# Patient Record
Sex: Female | Born: 1955 | Race: White | Hispanic: No | Marital: Married | Smoking: Never smoker
Health system: Southern US, Community
[De-identification: ages and names within clinical notes are randomized; demographics above are authoritative.]

## PROBLEM LIST (undated history)

## (undated) DIAGNOSIS — I493 Ventricular premature depolarization: Secondary | ICD-10-CM

## (undated) DIAGNOSIS — K635 Polyp of colon: Secondary | ICD-10-CM

## (undated) DIAGNOSIS — K219 Gastro-esophageal reflux disease without esophagitis: Secondary | ICD-10-CM

## (undated) DIAGNOSIS — I10 Essential (primary) hypertension: Secondary | ICD-10-CM

## (undated) DIAGNOSIS — K5792 Diverticulitis of intestine, part unspecified, without perforation or abscess without bleeding: Secondary | ICD-10-CM

## (undated) DIAGNOSIS — R079 Chest pain, unspecified: Secondary | ICD-10-CM

## (undated) DIAGNOSIS — T7840XA Allergy, unspecified, initial encounter: Secondary | ICD-10-CM

## (undated) DIAGNOSIS — J45909 Unspecified asthma, uncomplicated: Secondary | ICD-10-CM

## (undated) DIAGNOSIS — E039 Hypothyroidism, unspecified: Secondary | ICD-10-CM

## (undated) DIAGNOSIS — J309 Allergic rhinitis, unspecified: Secondary | ICD-10-CM

## (undated) DIAGNOSIS — H1010 Acute atopic conjunctivitis, unspecified eye: Secondary | ICD-10-CM

## (undated) HISTORY — DX: Hypothyroidism, unspecified: E03.9

## (undated) HISTORY — DX: Unspecified asthma, uncomplicated: J45.909

## (undated) HISTORY — PX: FINGER SURGERY: SHX640

## (undated) HISTORY — DX: Polyp of colon: K63.5

## (undated) HISTORY — PX: OTHER SURGICAL HISTORY: SHX169

## (undated) HISTORY — PX: CYST REMOVAL TRUNK: SHX6283

## (undated) HISTORY — DX: Allergic rhinitis, unspecified: J30.9

## (undated) HISTORY — DX: Ventricular premature depolarization: I49.3

## (undated) HISTORY — DX: Diverticulitis of intestine, part unspecified, without perforation or abscess without bleeding: K57.92

## (undated) HISTORY — PX: CHOLECYSTECTOMY: SHX55

## (undated) HISTORY — DX: Essential (primary) hypertension: I10

## (undated) HISTORY — DX: Acute atopic conjunctivitis, unspecified eye: H10.10

## (undated) HISTORY — DX: Gastro-esophageal reflux disease without esophagitis: K21.9

## (undated) HISTORY — DX: Chest pain, unspecified: R07.9

## (undated) HISTORY — DX: Allergy, unspecified, initial encounter: T78.40XA

## (undated) HISTORY — PX: OVARIAN CYST REMOVAL: SHX89

## (undated) HISTORY — PX: UMBILICAL HERNIA REPAIR: SUR1181

## (undated) HISTORY — PX: HERNIA REPAIR: SHX51

## (undated) HISTORY — PX: ABDOMINAL HYSTERECTOMY: SUR658

---

## 1998-07-12 ENCOUNTER — Other Ambulatory Visit: Admission: RE | Admit: 1998-07-12 | Discharge: 1998-07-12 | Payer: Self-pay | Admitting: *Deleted

## 1999-09-21 ENCOUNTER — Other Ambulatory Visit: Admission: RE | Admit: 1999-09-21 | Discharge: 1999-09-21 | Payer: Self-pay | Admitting: *Deleted

## 2000-09-24 ENCOUNTER — Other Ambulatory Visit: Admission: RE | Admit: 2000-09-24 | Discharge: 2000-09-24 | Payer: Self-pay | Admitting: *Deleted

## 2002-01-23 ENCOUNTER — Other Ambulatory Visit: Admission: RE | Admit: 2002-01-23 | Discharge: 2002-01-23 | Payer: Self-pay | Admitting: *Deleted

## 2003-02-16 ENCOUNTER — Other Ambulatory Visit: Admission: RE | Admit: 2003-02-16 | Discharge: 2003-02-16 | Payer: Self-pay | Admitting: *Deleted

## 2003-03-19 ENCOUNTER — Ambulatory Visit (HOSPITAL_BASED_OUTPATIENT_CLINIC_OR_DEPARTMENT_OTHER): Admission: RE | Admit: 2003-03-19 | Discharge: 2003-03-19 | Payer: Self-pay | Admitting: Orthopedic Surgery

## 2004-03-08 ENCOUNTER — Other Ambulatory Visit: Admission: RE | Admit: 2004-03-08 | Discharge: 2004-03-08 | Payer: Self-pay | Admitting: *Deleted

## 2004-05-04 ENCOUNTER — Other Ambulatory Visit: Admission: RE | Admit: 2004-05-04 | Discharge: 2004-05-04 | Payer: Self-pay | Admitting: *Deleted

## 2004-09-29 ENCOUNTER — Other Ambulatory Visit: Admission: RE | Admit: 2004-09-29 | Discharge: 2004-09-29 | Payer: Self-pay | Admitting: *Deleted

## 2004-12-21 ENCOUNTER — Other Ambulatory Visit: Admission: RE | Admit: 2004-12-21 | Discharge: 2004-12-21 | Payer: Self-pay | Admitting: *Deleted

## 2005-05-31 ENCOUNTER — Other Ambulatory Visit: Admission: RE | Admit: 2005-05-31 | Discharge: 2005-05-31 | Payer: Self-pay | Admitting: *Deleted

## 2005-10-16 ENCOUNTER — Other Ambulatory Visit: Admission: RE | Admit: 2005-10-16 | Discharge: 2005-10-16 | Payer: Self-pay | Admitting: *Deleted

## 2006-02-06 ENCOUNTER — Other Ambulatory Visit: Admission: RE | Admit: 2006-02-06 | Discharge: 2006-02-06 | Payer: Self-pay | Admitting: *Deleted

## 2006-05-30 ENCOUNTER — Other Ambulatory Visit: Admission: RE | Admit: 2006-05-30 | Discharge: 2006-05-30 | Payer: Self-pay | Admitting: *Deleted

## 2007-01-29 ENCOUNTER — Other Ambulatory Visit: Admission: RE | Admit: 2007-01-29 | Discharge: 2007-01-29 | Payer: Self-pay | Admitting: *Deleted

## 2007-05-13 ENCOUNTER — Other Ambulatory Visit: Admission: RE | Admit: 2007-05-13 | Discharge: 2007-05-13 | Payer: Self-pay | Admitting: *Deleted

## 2008-01-01 ENCOUNTER — Other Ambulatory Visit: Admission: RE | Admit: 2008-01-01 | Discharge: 2008-01-01 | Payer: Self-pay | Admitting: Gynecology

## 2010-12-09 NOTE — Op Note (Signed)
NAME:  Christy Mccoy, Christy Mccoy                           ACCOUNT NO.:  [id number]   MEDICAL RECORD NO.:  [id number]                   PATIENT TYPE:  AMB   LOCATION:  DSC                                  FACILITY:  [facility]   PHYSICIAN:  Katy Fitch. Naaman Plummer., M.D.          DATE OF BIRTH:  17-Jan-1956   DATE OF PROCEDURE:  03/19/2003  DATE OF DISCHARGE:                                 OPERATIVE REPORT   PREOPERATIVE DIAGNOSIS:  Intra-articular fracture, left ring finger distal  phalanx with retraction of insertion of extensor tendon approximately 3 mm  proximally.   POSTOPERATIVE DIAGNOSIS:  Intra-articular fracture, left ring finger distal  phalanx with retraction of insertion of extensor tendon approximately 3 mm  proximally.   OPERATION PERFORMED:  Open reduction internal fixation of articular fracture  of the left ring finger distal phalanx with 28 gauge wire reconstruction of  extensor mechanism with transosseous tension band.   SURGEON:  Katy Fitch. Sypher, M.D.   ASSISTANT:  None.   ANESTHESIA:  General by LMA.   SUPERVISING ANESTHESIOLOGIST:  Judie Petit, M.D.   INDICATIONS FOR PROCEDURE:  Christy Mccoy is a 55 year old woman who was  injured while playing football catch with a friend approximately three weeks  prior.  She sustained a mallet fracture of her left ring finger that was  significantly displaced. She was treated with closed splinting for three  weeks without success.  She was then referred for an upper extremity  orthopedic consult and was noted to have about 3 mm displacement between the  fracture fragment and the articular surface main fragment of the distal  phalanx.   Due to failure to respond to nonoperative measures, the patient is brought  to the operating room at this time for reconstruction of her intra-articular  fracture and Kirschner wire fixation of her DIP joint in extension.   DESCRIPTION OF PROCEDURE:  [name] was brought to the operating room  and  placed in supine position on the operating table.  Following induction of  general orotracheal anesthesia, the left arm was prepped with Betadine soap  and solution and sterilely draped.  Following exsanguination of the left arm  with an Esmarch bandage, an arterial tourniquet on the proximal brachium was  inflated to 220 mmHg. The procedure commenced with a curvilinear incision  exposing the extensor mechanism.  The fracture fragment was palpated and  identified with a C-arm fluoroscope using a 27 gauge needle.   The fracture site was opened, cleaned of soft tissues and the fracture  fragment captured with a tension band 28 gauge wire.  Two 0.028 inch  Kirschner wire drill holes were drilled across the distal phalanx allowing  reconstruction of the articular fracture fragment anatomically utilizing a  tension band technique.  The wires were brought out the pulp of the ring  finger which was then explored through a midline incision.  Soft tissues  were cleared  to the level of the periosteum and flexor tendon insertion  followed by use of a wire twist technique to tension the articular fracture  fragment into anatomic position.   A 0.035 inch Kirschner wire was then drilled across the DIP joint  maintaining the DIP in three degrees hyper-extension.  An anatomic  reconstruction of the fracture was achieved.  The wounds were then irrigated  followed by repair of the pulp wound with 5-0 chromic and repair of the  dorsal wound with 5-0 nylon.  The wounds were dressed with collodion,  Xeroflo.  The pin was addressed in the manner followed by placement of an  Alumafoam and Coban dressing.  There were no apparent complications.                                                Katy Fitch Naaman Plummer., M.D.    RVS/MEDQ  D:  03/19/2003  T:  03/20/2003  Job:  [id number]

## 2014-07-24 HISTORY — PX: COLONOSCOPY: SHX174

## 2015-04-15 DIAGNOSIS — J45909 Unspecified asthma, uncomplicated: Secondary | ICD-10-CM | POA: Insufficient documentation

## 2015-04-15 DIAGNOSIS — J45901 Unspecified asthma with (acute) exacerbation: Secondary | ICD-10-CM

## 2015-04-15 DIAGNOSIS — K219 Gastro-esophageal reflux disease without esophagitis: Secondary | ICD-10-CM

## 2015-04-15 HISTORY — DX: Unspecified asthma with (acute) exacerbation: J45.901

## 2015-05-21 ENCOUNTER — Other Ambulatory Visit: Payer: Self-pay

## 2015-05-21 MED ORDER — RANITIDINE HCL 300 MG PO TABS: 300.0000 mg | ORAL_TABLET | Freq: Every day | ORAL | Status: DC

## 2015-07-22 ENCOUNTER — Other Ambulatory Visit: Payer: Self-pay | Admitting: *Deleted

## 2015-07-22 MED ORDER — OMEPRAZOLE 40 MG PO CPDR: DELAYED_RELEASE_CAPSULE | ORAL | Status: DC

## 2015-11-18 ENCOUNTER — Encounter: Payer: Self-pay | Admitting: Allergy and Immunology

## 2015-11-18 ENCOUNTER — Ambulatory Visit (INDEPENDENT_AMBULATORY_CARE_PROVIDER_SITE_OTHER): Payer: BC Managed Care – PPO | Admitting: Allergy and Immunology

## 2015-11-18 VITALS — BP 130/74 | HR 92 | Resp 16 | Ht 63.86 in | Wt 169.2 lb

## 2015-11-18 DIAGNOSIS — J387 Other diseases of larynx: Secondary | ICD-10-CM

## 2015-11-18 DIAGNOSIS — J309 Allergic rhinitis, unspecified: Secondary | ICD-10-CM

## 2015-11-18 DIAGNOSIS — H1010 Acute atopic conjunctivitis, unspecified eye: Secondary | ICD-10-CM

## 2015-11-18 DIAGNOSIS — J4530 Mild persistent asthma, uncomplicated: Secondary | ICD-10-CM

## 2015-11-18 DIAGNOSIS — K219 Gastro-esophageal reflux disease without esophagitis: Secondary | ICD-10-CM

## 2015-11-18 MED ORDER — RANITIDINE HCL 300 MG PO TABS: 300.0000 mg | ORAL_TABLET | Freq: Every day | ORAL | Status: DC

## 2015-11-18 MED ORDER — OMEPRAZOLE 40 MG PO CPDR: DELAYED_RELEASE_CAPSULE | ORAL | Status: DC

## 2015-11-18 NOTE — Progress Notes (Signed)
Follow-up Note  Referring Provider: No ref. provider found Primary Provider: [name], MD Date of Office Visit: 11/18/2015  Subjective:   Christy Mccoy (DOB: 1956-02-21) is a 60 y.o. female who returns to the [facility] [place] on 11/18/2015 in re-evaluation of the following:  HPI: Christy Mccoy returns to this clinic in reevaluation of her asthma and allergic rhinitis and LPR. I've not seen her in his clinic in 1 year.  During the interval she has seen Dr. donor and has seen Dr. Laurance Flatten. Apparently there has not been a particularly big change in her medical therapy other than the fact that she consistently uses her nasal steroid at this point in time. She is always been consistently using a combination of omeprazole 40 mg in the morning and ranitidine 300 mg in the evening and has continued on Qvar twice a day. It does sound as though her Qvar dose was increased by Dr. donor but otherwise no other significant manipulation of her medical therapy was made. She apparently did have rhinoscopy performed by Dr. Laurance Flatten which identified a irritated mid airway. She still drinks caffeine and eats chocolate but she has eliminated wine throughout the week and still has some wine on the weekends. Presently she has no wheezing or coughing or shortness of breath or exercise induced bronchospastic symptoms or the need to use a short acting bronchodilator. She has no nasal congestion and no sneezing.    Medication List           BIOTIN PO  Take by mouth daily.     cetirizine 10 MG tablet  Commonly known as:  ZYRTEC  Take 10 mg by mouth daily as needed for allergies.     estradiol 0.5 MG tablet  Commonly known as:  ESTRACE  Take by mouth daily.     fluticasone 50 MCG/ACT nasal spray  Commonly known as:  FLONASE  Place 1 spray into the nose daily.     levothyroxine 88 MCG tablet  Commonly known as:  SYNTHROID, LEVOTHROID  Take 88 mcg by mouth daily.     montelukast 10 MG tablet  Commonly  known as:  SINGULAIR     MULTIVITAMIN ADULT PO  Take by mouth daily.     omeprazole 40 MG capsule  Commonly known as:  PRILOSEC  TAKE ONE CAPSULE EACH MORNING BEFORE BREAKFAST     QVAR 80 MCG/ACT inhaler  Generic drug:  beclomethasone  Inhale 2 puffs into the lungs 2 (two) times daily.     ranitidine 300 MG tablet  Commonly known as:  ZANTAC  Take 1 tablet (300 mg total) by mouth at bedtime.     VENTOLIN HFA 108 (90 Base) MCG/ACT inhaler  Generic drug:  albuterol  Inhale 2 puffs into the lungs every 6 (six) hours as needed for wheezing or shortness of breath.     VITAMIN B 12 PO  Take by mouth daily.     VITAMIN C PO  Take by mouth daily.        Past Medical History  Diagnosis Date  . Asthma   . GERD (gastroesophageal reflux disease)   . Hypothyroidism     Past Surgical History  Procedure Laterality Date  . Ovarian cyst removal  Age 60  . Abdominal hysterectomy  Age 86  . Hernia repair    . Cyst removal trunk    . Finger surgery Left     Left ring finger    Allergies  Allergen Reactions  .  Lisinopril Other (See Comments)    UNKNOWN  . Codeine Anxiety    Review of systems negative except as noted in HPI / PMHx or noted below:  Review of Systems  Constitutional: Negative.   HENT: Negative.   Eyes: Negative.   Respiratory: Negative.   Cardiovascular: Negative.   Gastrointestinal: Negative.   Genitourinary: Negative.   Musculoskeletal: Negative.   Skin: Negative.   Neurological: Negative.   Endo/Heme/Allergies: Negative.   Psychiatric/Behavioral: Negative.      Objective:   There were no vitals filed for this visit. Height: 5' 3.86" (162.2 cm)  Weight: 169 lb 3.2 oz (76.749 kg)   Physical Exam  Constitutional: She is well-developed, well-nourished, and in no distress.  HENT:  Head: Normocephalic.  Right Ear: Tympanic membrane, external ear and ear canal normal.  Left Ear: Tympanic membrane, external ear and ear canal normal.  Nose: Nose  normal. No mucosal edema or rhinorrhea.  Mouth/Throat: Uvula is midline, oropharynx is clear and moist and mucous membranes are normal. No oropharyngeal exudate.  Eyes: Conjunctivae are normal.  Neck: Trachea normal. No tracheal tenderness present. No tracheal deviation present. No thyromegaly present.  Cardiovascular: Normal rate, regular rhythm, S1 normal, S2 normal and normal heart sounds.   No murmur heard. Pulmonary/Chest: Breath sounds normal. No stridor. No respiratory distress. She has no wheezes. She has no rales.  Musculoskeletal: She exhibits no edema.  Lymphadenopathy:       Head (right side): No tonsillar adenopathy present.       Head (left side): No tonsillar adenopathy present.    She has no cervical adenopathy.  Neurological: She is alert. Gait normal.  Skin: No rash noted. She is not diaphoretic. No erythema. Nails show no clubbing.  Psychiatric: Mood and affect normal.    Diagnostics:    Spirometry was performed and demonstrated an FEV1 of 1.98 at 78 % of predicted.  The patient had an Asthma Control Test with the following results:  .    Assessment and Plan:   1. Asthma, well controlled, mild persistent   2. LPRD (laryngopharyngeal reflux disease)   3. Allergic rhinoconjunctivitis     1. Continue Qvar 80 2 inhalations twice a day  2. Continue combination of omeprazole 40 mg in a.m. plus ranitidine 300 mg in PM  3. Continue Flonase one-2 sprays each nostril one time per day  4. Continue Ventolin HFA if needed  5. Continue Zyrtec if needed  6. Consider discontinue montelukast  7. Return to clinic in 1 year or earlier if problem  Christy Mccoy is doing relatively well at this point in time and I see no need for changing her medical therapy. I will see her back in this clinic in approximately one year or earlier if there is a problem. I did have a talk with her today about consolidating her caffeine and chocolate consumption, minimizing alcohol consumption, and  attempting to get to ideal body weight, which would help her reflux-induced respiratory disease.  Allena Katz, MD [place]

## 2015-11-18 NOTE — Patient Instructions (Addendum)
  1. Continue Qvar 80 2 inhalations twice a day  2. Continue combination of omeprazole 40 mg in a.m. plus ranitidine 300 mg in PM  3. Continue Flonase one-2 sprays each nostril one time per day  4. Continue Ventolin HFA if needed  5. Continue Zyrtec if needed  6. Consider discontinue montelukast  7. Return to clinic in 1 year or earlier if problem

## 2016-04-03 DIAGNOSIS — K219 Gastro-esophageal reflux disease without esophagitis: Secondary | ICD-10-CM | POA: Insufficient documentation

## 2016-04-03 HISTORY — DX: Gastro-esophageal reflux disease without esophagitis: K21.9

## 2016-04-07 DIAGNOSIS — J019 Acute sinusitis, unspecified: Secondary | ICD-10-CM

## 2016-04-07 HISTORY — DX: Acute sinusitis, unspecified: J01.90

## 2016-07-04 DIAGNOSIS — I493 Ventricular premature depolarization: Secondary | ICD-10-CM

## 2016-07-04 DIAGNOSIS — I119 Hypertensive heart disease without heart failure: Secondary | ICD-10-CM

## 2016-07-04 DIAGNOSIS — E039 Hypothyroidism, unspecified: Secondary | ICD-10-CM | POA: Insufficient documentation

## 2016-07-04 HISTORY — DX: Hypertensive heart disease without heart failure: I11.9

## 2016-07-04 HISTORY — DX: Ventricular premature depolarization: I49.3

## 2016-11-02 ENCOUNTER — Other Ambulatory Visit: Payer: Self-pay | Admitting: *Deleted

## 2016-11-02 MED ORDER — OMEPRAZOLE 40 MG PO CPDR: DELAYED_RELEASE_CAPSULE | ORAL | 0 refills | Status: DC

## 2018-09-09 ENCOUNTER — Encounter: Payer: Self-pay | Admitting: *Deleted

## 2018-09-09 DIAGNOSIS — R002 Palpitations: Secondary | ICD-10-CM | POA: Insufficient documentation

## 2018-09-09 HISTORY — DX: Palpitations: R00.2

## 2018-09-10 NOTE — H&P (View-Only) (Signed)
Cardiology Office Note:    Date:  09/11/2018   ID:  Christy Mccoy, DOB Dec 24, 1955, MRN [id number]  PCP:  Algis Greenhouse, MD  Cardiologist:  Shirlee More, MD   Referring MD: Algis Greenhouse, MD  ASSESSMENT:    1. Premature ventricular contractions   2. Essential hypertension   3. Intermittent asthma without complication, unspecified asthma severity   4. Acquired hypothyroidism   5. SOB (shortness of breath)   6. Chest pain in adult   7. Chest pain, unspecified type   8. Palpitations    PLAN:     I phoned and reviewed the results of echocardiogram with patient.  She has mild left ventricular dysfunction left atrial enlargement which seems disproportionate to her mild hypertension.  She has severe asthma cannot tolerate beta-blockers and has had exertional chest pain.  Ideally should be best served by cardiac CTA but I would not challenge her with beta-blocker for heart rate control.  I am concerned about the clinical accuracy of a myocardial perfusion study in this situation especially of evidence of left ventricular dysfunction.  We reviewed the options of treatment including diagnostic coronary angiography risk benefits and options including 2-day myocardial perfusion study and after a nice discussion both agree that she has been served with diagnostic coronary angiogram.  I reached out and discussed the case with her primary care physician Dr. Garlon Hatchet prior to calling her and reviewed with 1 of her interventional cardiologist when I was in [facility] yesterday morning.  She has no dye allergy no contraindication to dual antiplatelet therapy and I think at this time is the only diagnostic test that will answer the question of whether or not her exertional shortness of breath or chest pain is due to coronary artery disease or to her underlying asthma. In order of problems listed above:  1. PVCs were frequent associated worsening respiratory disease I am unsure of the burden of arrhythmia and  I asked her to wear a 3-day ZIO monitor to assess the frequency and severity of arrhythmia.  Also echocardiogram regarding cardiomyopathy. 2. Poorly controlled I asked her to add distal diuretic spironolactone 25 mg daily and monitor home blood pressures continue calcium channel blocker improving 3. Improving managed by her family physician 4. Stable continue current thyroid supplement 5. Shortness of breath is ongoing at times disproportionate to her asthma for further evaluation check d-dimer as a screen for venous thromboembolism proBNP level and echocardiogram. 6. She is having nonexertional chest pain ideally should be a good candidate for cardiac CTA but will require beta-blocker for heart rate control at this time I think that would be a problem with her ongoing asthma.  She thinks she can walk on a treadmill we will do a stress Myoview study in my office as a screen for CAD 7. 3-day ZIO monitor to assess arrhythmia  Next appointment   4-6 wks   Medication Adjustments/Labs and Tests Ordered: Current medicines are reviewed at length with the patient today.  Concerns regarding medicines are outlined above.  Orders Placed This Encounter  Procedures  . Pro b natriuretic peptide (BNP)  . D-Dimer, Quantitative  . MYOCARDIAL PERFUSION IMAGING  . LONG TERM MONITOR (3-14 DAYS)  . EKG 12-Lead  . ECHOCARDIOGRAM COMPLETE   Meds ordered this encounter  Medications  . spironolactone (ALDACTONE) 25 MG tablet    Sig: Take 1 tablet (25 mg total) by mouth daily.    Dispense:  30 tablet    Refill:  1  Chief Complaint  Patient presents with  . Shortness of Breath  . Palpitations  . Hypertension  . Chest Pain    History of Present Illness:    Christy Mccoy is a 63 y.o. female who is being seen today for the evaluation of palpitation at the request of Dough, Jaymes Graff, MD. She has a history of hypertension PVCs and was worsened in the setting of acute sinusitis.  She has a history of  asthma previously was cared for by pulmonologist and [place].  Recently she flared for a week she has been sick cough sputum wheezing shortness of breath and severe palpitation where her heart felt forceful and irregular.  Recent EKG showed frequent PVCs.  Last few days she feels better and is not having palpitation she also has hypertension and remains above target on a calcium channel blocker.  She has intermittent shortness of breath at times she can play tennis another time she is short of breath with activity associated with her lung disease she has nonexertional chest tightness that is unrelieved with her bronchodilators.  She has no history of congenital rheumatic heart disease or heart murmur and takes oral estrogen.  Because of her worsened and persistent symptoms of shortness of breath chest tightness palpitation uncontrolled hypertension she is seen by me today.  She presently is using both maintenance as well as rescue inhaler 4 times a day.  Past Medical History:  Diagnosis Date  . Allergic rhinoconjunctivitis   . Asthma   . GERD (gastroesophageal reflux disease)   . Hypertension   . Hypothyroidism   . PVC's (premature ventricular contractions)     Past Surgical History:  Procedure Laterality Date  . ABDOMINAL HYSTERECTOMY  Age 24  . CYST REMOVAL TRUNK    . FINGER SURGERY Left    Left ring finger  . HERNIA REPAIR    . laparoscopic cholecystectomy    . OVARIAN CYST REMOVAL  Age 30    Current Medications: Current Meds  Medication Sig  . Albuterol Sulfate (PROAIR RESPICLICK) 657 (90 Base) MCG/ACT AEPB Inhale two doses every four to six hours as needed for cough or wheeze.  Marland Kitchen amoxicillin-clavulanate (AUGMENTIN) 875-125 MG tablet Take 1 tablet by mouth 2 (two) times daily.   . Ascorbic Acid (VITAMIN C PO) Take 1 tablet by mouth daily.   . beclomethasone (QVAR REDIHALER) 80 MCG/ACT inhaler INHALE 2 PUFFS INTO THE LUNGS 2 TIMES A DAY  . BIOTIN PO Take 1 tablet by mouth daily.     . cetirizine (ZYRTEC) 10 MG tablet Take 10 mg by mouth daily.   Marland Kitchen estradiol (ESTRACE) 0.5 MG tablet Take 0.5 mg by mouth daily.   . fluticasone (FLONASE) 50 MCG/ACT nasal spray Place 1 spray into the nose daily as needed.   Marland Kitchen levothyroxine (SYNTHROID, LEVOTHROID) 88 MCG tablet Take 88 mcg by mouth daily.  . montelukast (SINGULAIR) 10 MG tablet Take 10 mg by mouth.   . Multiple Vitamins-Minerals (MULTIVITAMIN ADULT PO) Take 1 tablet by mouth daily.   Marland Kitchen omeprazole (PRILOSEC) 40 MG capsule TAKE ONE CAPSULE EACH MORNING BEFORE BREAKFAST  . promethazine (PHENERGAN) 25 MG tablet Take 25 mg by mouth every 4 (four) hours as needed.   . ranitidine (ZANTAC) 300 MG tablet Take 1 tablet (300 mg total) by mouth at bedtime.  . verapamil (VERELAN PM) 240 MG 24 hr capsule Take 240 mg by mouth daily.      Allergies:   Lisinopril and Codeine   Social History  Socioeconomic History  . Marital status: Married    Spouse name: Not on file  . Number of children: Not on file  . Years of education: Not on file  . Highest education level: Not on file  Occupational History  . Not on file  Social Needs  . Financial resource strain: Not on file  . Food insecurity:    Worry: Not on file    Inability: Not on file  . Transportation needs:    Medical: Not on file    Non-medical: Not on file  Tobacco Use  . Smoking status: Never Smoker  . Smokeless tobacco: Never Used  Substance and Sexual Activity  . Alcohol use: Yes    Comment: social  . Drug use: Never  . Sexual activity: Not on file  Lifestyle  . Physical activity:    Days per week: Not on file    Minutes per session: Not on file  . Stress: Not on file  Relationships  . Social connections:    Talks on phone: Not on file    Gets together: Not on file    Attends religious service: Not on file    Active member of club or organization: Not on file    Attends meetings of clubs or organizations: Not on file    Relationship status: Not on file   Other Topics Concern  . Not on file  Social History Narrative  . Not on file     Family History: The patient's family history includes Dementia in her father; Diabetes in her father; Heart attack in her paternal grandfather; Lymphoma in her mother; Stroke in her maternal grandmother and paternal grandmother.  ROS:   Review of Systems  Constitution: Positive for weight gain.  HENT: Positive for congestion.   Eyes: Positive for visual disturbance.  Cardiovascular: Positive for chest pain, dyspnea on exertion, irregular heartbeat and palpitations.  Respiratory: Positive for cough, shortness of breath, sputum production and wheezing.   Endocrine: Negative.   Hematologic/Lymphatic: Negative.   Skin: Negative.   Musculoskeletal: Negative.   Gastrointestinal: Negative.   Genitourinary: Negative.   Neurological: Negative.   Psychiatric/Behavioral: Negative.   Allergic/Immunologic: Negative.    Please see the history of present illness.     All other systems reviewed and are negative.  EKGs/Labs/Other Studies Reviewed:    The following studies were reviewed today:  EKG performed 08/19/2018 showed sinus rhythm with occasional PVCs  EKG:  EKG is  ordered today.  The ekg ordered today demonstrates [place] normal no PVC's  Chest x-ray performed 09/05/2018 normal  Recent Labs: Results for orders placed or performed in visit on 08/19/18  Comprehensive Metabolic Panel  Result Value Ref Range  Sodium 141 135 - 146 MMOL/L  Potassium 4.3 3.5 - 5.3 MMOL/L  Chloride 105 98 - 110 MMOL/L  CO2 29 23 - 30 MMOL/L  BUN 13 8 - 24 MG/DL  Glucose 98 70 - 99 MG/DL  Creatinine 0.69 0.50 - 1.50 MG/DL  Calcium 9.2 8.5 - 10.5 MG/DL  Total Protein 6.5 6.0 - 8.3 G/DL  Albumin 4.0 3.5 - 5.0 G/DL  Total Bilirubin 0.3 0.1 - 1.2 MG/DL  Alkaline Phosphatase 82 25 - 125 IU/L or U/L  AST (SGOT) 17 5 - 40 IU/L or U/L  ALT (SGPT) 19 5 - 50 IU/L or U/L  Anion Gap 7 4 - 14 MMOL/L  Est. GFR Non-African  American >=90 >=60 ML/MIN/1.73 M*2  TSH, 3rd Generation  Result Value Ref Range  TSH  2.305 0.450 - 5.330 UIU/ML  CBC and Differential  Result Value Ref Range  WBC 7.1 4.8 - 10.8 x 10*3/uL  RBC 4.75 4.20 - 5.40 x 10*6/uL  Hemoglobin 14.2 12.0 - 16.0 G/DL  Hematocrit 42.9 37.0 - 47.0 %  MCV 90.3 81.0 - 99.0 FL  MCH 29.9 27.0 - 31.0 PG  MCHC 33.1 33.0 - 37.0 G/DL  RDW 13.1 11.5 - 14.5 %  Platelets 298 160 - 360 X 10*3/uL  MPV 8.6 6.8 - 10.2 FL  Neutrophil % 64 %  Lymphocyte % 27 %  Monocyte % 7 %  Eosinophil % 1 %  Basophil % 1 %  Neutrophil Absolute 4.5 1.6 - 7.3 x 10*3/uL  Lymphocyte Absolute 1.9 1.0 - 5.1 x 10*3/uL  Monocyte Absolute 0.5 0.1 - 0.9 x 10*3/uL  Eosinophil Absolute 0.1 0.0 - 0.5 x 10*3/uL  Basophil Absolute 0.0 0.0 - 0.2 x 10*3/uL    Physical Exam:    VS:  BP (!) 144/96 (BP Location: Left Arm, Patient Position: Sitting, Cuff Size: Normal)   Pulse (!) 102   Ht 5' 4.5" (1.638 m)   Wt 168 lb 9.6 oz (76.5 kg)   SpO2 98%   BMI 28.49 kg/m     Wt Readings from Last 3 Encounters:  09/11/18 168 lb 9.6 oz (76.5 kg)  11/18/15 169 lb 3.2 oz (76.7 kg)     GEN:  Well nourished, well developed in no acute distress HEENT: Normal NECK: No JVD; No carotid bruits LYMPHATICS: No lymphadenopathy CARDIAC: RRR, no murmurs, rubs, gallops RESPIRATORY:  Clear to auscultation without rales, wheezing or rhonchi  ABDOMEN: Soft, non-tender, non-distended MUSCULOSKELETAL:  No edema; No deformity  SKIN: Warm and dry NEUROLOGIC:  Alert and oriented x 3 PSYCHIATRIC:  Normal affect     Signed, Shirlee More, MD  09/11/2018 2:48 PM    [place]

## 2018-09-10 NOTE — Progress Notes (Addendum)
Cardiology Office Note:    Date:  09/11/2018   ID:  Christy Mccoy, DOB [birth year], MRN [id number]  PCP:  Christy Greenhouse, MD  Cardiologist:  Christy More, MD   Referring MD: Christy Greenhouse, MD  ASSESSMENT:    1. Premature ventricular contractions   2. Essential hypertension   3. Intermittent asthma without complication, unspecified asthma severity   4. Acquired hypothyroidism   5. SOB (shortness of breath)   6. Chest pain in adult   7. Chest pain, unspecified type   8. Palpitations    PLAN:     I phoned and reviewed the results of echocardiogram with patient.  She has mild left ventricular dysfunction left atrial enlargement which seems disproportionate to her mild hypertension.  She has severe asthma cannot tolerate beta-blockers and has had exertional chest pain.  Ideally should be best served by cardiac CTA but I would not challenge her with beta-blocker for heart rate control.  I am concerned about the clinical accuracy of a myocardial perfusion study in this situation especially of evidence of left ventricular dysfunction.  We reviewed the options of treatment including diagnostic coronary angiography risk benefits and options including 2-day myocardial perfusion study and after a nice discussion both agree that she has been served with diagnostic coronary angiogram.  I reached out and discussed the case with her primary care physician Dr. Garlon Mccoy prior to calling her and reviewed with 1 of her interventional cardiologist when I was in [facility] yesterday morning.  She has no dye allergy no contraindication to dual antiplatelet therapy and I think at this time is the only diagnostic test that will answer the question of whether or not her exertional shortness of breath or chest pain is due to coronary artery disease or to her underlying asthma. In order of problems listed above:  1. PVCs were frequent associated worsening respiratory disease I am unsure of the burden of arrhythmia and  I asked her to wear a 3-day ZIO monitor to assess the frequency and severity of arrhythmia.  Also echocardiogram regarding cardiomyopathy. 2. Poorly controlled I asked her to add distal diuretic spironolactone 25 mg daily and monitor home blood pressures continue calcium channel blocker improving 3. Improving managed by her family physician 4. Stable continue current thyroid supplement 5. Shortness of breath is ongoing at times disproportionate to her asthma for further evaluation check d-dimer as a screen for venous thromboembolism proBNP level and echocardiogram. 6. She is having nonexertional chest pain ideally should be a good candidate for cardiac CTA but will require beta-blocker for heart rate control at this time I think that would be a problem with her ongoing asthma.  She thinks she can walk on a treadmill we will do a stress Myoview study in my office as a screen for CAD 7. 3-day ZIO monitor to assess arrhythmia  Next appointment   4-6 wks   Medication Adjustments/Labs and Tests Ordered: Current medicines are reviewed at length with the patient today.  Concerns regarding medicines are outlined above.  Orders Placed This Encounter  Procedures  . Pro b natriuretic peptide (BNP)  . D-Dimer, Quantitative  . MYOCARDIAL PERFUSION IMAGING  . LONG TERM MONITOR (3-14 DAYS)  . EKG 12-Lead  . ECHOCARDIOGRAM COMPLETE   Meds ordered this encounter  Medications  . spironolactone (ALDACTONE) 25 MG tablet    Sig: Take 1 tablet (25 mg total) by mouth daily.    Dispense:  30 tablet    Refill:  1  Chief Complaint  Patient presents with  . Shortness of Breath  . Palpitations  . Hypertension  . Chest Pain    History of Present Illness:    Christy Mccoy is a 63 y.o. female who is being seen today for the evaluation of palpitation at the request of Dough, Christy Graff, MD. She has a history of hypertension PVCs and was worsened in the setting of acute sinusitis.  She has a history of  asthma previously was cared for by pulmonologist and [place].  Recently she flared for a week she has been sick cough sputum wheezing shortness of breath and severe palpitation where her heart felt forceful and irregular.  Recent EKG showed frequent PVCs.  Last few days she feels better and is not having palpitation she also has hypertension and remains above target on a calcium channel blocker.  She has intermittent shortness of breath at times she can play tennis another time she is short of breath with activity associated with her lung disease she has nonexertional chest tightness that is unrelieved with her bronchodilators.  She has no history of congenital rheumatic heart disease or heart murmur and takes oral estrogen.  Because of her worsened and persistent symptoms of shortness of breath chest tightness palpitation uncontrolled hypertension she is seen by me today.  She presently is using both maintenance as well as rescue inhaler 4 times a day.  Past Medical History:  Diagnosis Date  . Allergic rhinoconjunctivitis   . Asthma   . GERD (gastroesophageal reflux disease)   . Hypertension   . Hypothyroidism   . PVC's (premature ventricular contractions)     Past Surgical History:  Procedure Laterality Date  . ABDOMINAL HYSTERECTOMY  Age 68  . CYST REMOVAL TRUNK    . FINGER SURGERY Left    Left ring finger  . HERNIA REPAIR    . laparoscopic cholecystectomy    . OVARIAN CYST REMOVAL  Age 58    Current Medications: Current Meds  Medication Sig  . Albuterol Sulfate (PROAIR RESPICLICK) 952 (90 Base) MCG/ACT AEPB Inhale two doses every four to six hours as needed for cough or wheeze.  Marland Kitchen amoxicillin-clavulanate (AUGMENTIN) 875-125 MG tablet Take 1 tablet by mouth 2 (two) times daily.   . Ascorbic Acid (VITAMIN C PO) Take 1 tablet by mouth daily.   . beclomethasone (QVAR REDIHALER) 80 MCG/ACT inhaler INHALE 2 PUFFS INTO THE LUNGS 2 TIMES A DAY  . BIOTIN PO Take 1 tablet by mouth daily.     . cetirizine (ZYRTEC) 10 MG tablet Take 10 mg by mouth daily.   Marland Kitchen estradiol (ESTRACE) 0.5 MG tablet Take 0.5 mg by mouth daily.   . fluticasone (FLONASE) 50 MCG/ACT nasal spray Place 1 spray into the nose daily as needed.   Marland Kitchen levothyroxine (SYNTHROID, LEVOTHROID) 88 MCG tablet Take 88 mcg by mouth daily.  . montelukast (SINGULAIR) 10 MG tablet Take 10 mg by mouth.   . Multiple Vitamins-Minerals (MULTIVITAMIN ADULT PO) Take 1 tablet by mouth daily.   Marland Kitchen omeprazole (PRILOSEC) 40 MG capsule TAKE ONE CAPSULE EACH MORNING BEFORE BREAKFAST  . promethazine (PHENERGAN) 25 MG tablet Take 25 mg by mouth every 4 (four) hours as needed.   . ranitidine (ZANTAC) 300 MG tablet Take 1 tablet (300 mg total) by mouth at bedtime.  . verapamil (VERELAN PM) 240 MG 24 hr capsule Take 240 mg by mouth daily.      Allergies:   Lisinopril and Codeine   Social History  Socioeconomic History  . Marital status: Married    Spouse name: Not on file  . Number of children: Not on file  . Years of education: Not on file  . Highest education level: Not on file  Occupational History  . Not on file  Social Needs  . Financial resource strain: Not on file  . Food insecurity:    Worry: Not on file    Inability: Not on file  . Transportation needs:    Medical: Not on file    Non-medical: Not on file  Tobacco Use  . Smoking status: Never Smoker  . Smokeless tobacco: Never Used  Substance and Sexual Activity  . Alcohol use: Yes    Comment: social  . Drug use: Never  . Sexual activity: Not on file  Lifestyle  . Physical activity:    Days per week: Not on file    Minutes per session: Not on file  . Stress: Not on file  Relationships  . Social connections:    Talks on phone: Not on file    Gets together: Not on file    Attends religious service: Not on file    Active member of club or organization: Not on file    Attends meetings of clubs or organizations: Not on file    Relationship status: Not on file   Other Topics Concern  . Not on file  Social History Narrative  . Not on file     Family History: The patient's family history includes Dementia in her father; Diabetes in her father; Heart attack in her paternal grandfather; Lymphoma in her mother; Stroke in her maternal grandmother and paternal grandmother.  ROS:   Review of Systems  Constitution: Positive for weight gain.  HENT: Positive for congestion.   Eyes: Positive for visual disturbance.  Cardiovascular: Positive for chest pain, dyspnea on exertion, irregular heartbeat and palpitations.  Respiratory: Positive for cough, shortness of breath, sputum production and wheezing.   Endocrine: Negative.   Hematologic/Lymphatic: Negative.   Skin: Negative.   Musculoskeletal: Negative.   Gastrointestinal: Negative.   Genitourinary: Negative.   Neurological: Negative.   Psychiatric/Behavioral: Negative.   Allergic/Immunologic: Negative.    Please see the history of present illness.     All other systems reviewed and are negative.  EKGs/Labs/Other Studies Reviewed:    The following studies were reviewed today:  EKG performed 08/19/2018 showed sinus rhythm with occasional PVCs  EKG:  EKG is  ordered today.  The ekg ordered today demonstrates [place] normal no PVC's  Chest x-ray performed 09/05/2018 normal  Recent Labs: Results for orders placed or performed in visit on 08/19/18  Comprehensive Metabolic Panel  Result Value Ref Range  Sodium 141 135 - 146 MMOL/L  Potassium 4.3 3.5 - 5.3 MMOL/L  Chloride 105 98 - 110 MMOL/L  CO2 29 23 - 30 MMOL/L  BUN 13 8 - 24 MG/DL  Glucose 98 70 - 99 MG/DL  Creatinine 0.69 0.50 - 1.50 MG/DL  Calcium 9.2 8.5 - 10.5 MG/DL  Total Protein 6.5 6.0 - 8.3 G/DL  Albumin 4.0 3.5 - 5.0 G/DL  Total Bilirubin 0.3 0.1 - 1.2 MG/DL  Alkaline Phosphatase 82 25 - 125 IU/L or U/L  AST (SGOT) 17 5 - 40 IU/L or U/L  ALT (SGPT) 19 5 - 50 IU/L or U/L  Anion Gap 7 4 - 14 MMOL/L  Est. GFR Non-African  American >=90 >=60 ML/MIN/1.73 M*2  TSH, 3rd Generation  Result Value Ref Range  TSH  2.305 0.450 - 5.330 UIU/ML  CBC and Differential  Result Value Ref Range  WBC 7.1 4.8 - 10.8 x 10*3/uL  RBC 4.75 4.20 - 5.40 x 10*6/uL  Hemoglobin 14.2 12.0 - 16.0 G/DL  Hematocrit 42.9 37.0 - 47.0 %  MCV 90.3 81.0 - 99.0 FL  MCH 29.9 27.0 - 31.0 PG  MCHC 33.1 33.0 - 37.0 G/DL  RDW 13.1 11.5 - 14.5 %  Platelets 298 160 - 360 X 10*3/uL  MPV 8.6 6.8 - 10.2 FL  Neutrophil % 64 %  Lymphocyte % 27 %  Monocyte % 7 %  Eosinophil % 1 %  Basophil % 1 %  Neutrophil Absolute 4.5 1.6 - 7.3 x 10*3/uL  Lymphocyte Absolute 1.9 1.0 - 5.1 x 10*3/uL  Monocyte Absolute 0.5 0.1 - 0.9 x 10*3/uL  Eosinophil Absolute 0.1 0.0 - 0.5 x 10*3/uL  Basophil Absolute 0.0 0.0 - 0.2 x 10*3/uL    Physical Exam:    VS:  BP (!) 144/96 (BP Location: Left Arm, Patient Position: Sitting, Cuff Size: Normal)   Pulse (!) 102   Ht 5' 4.5" (1.638 m)   Wt 168 lb 9.6 oz (76.5 kg)   SpO2 98%   BMI 28.49 kg/m     Wt Readings from Last 3 Encounters:  09/11/18 168 lb 9.6 oz (76.5 kg)  11/18/15 169 lb 3.2 oz (76.7 kg)     GEN:  Well nourished, well developed in no acute distress HEENT: Normal NECK: No JVD; No carotid bruits LYMPHATICS: No lymphadenopathy CARDIAC: RRR, no murmurs, rubs, gallops RESPIRATORY:  Clear to auscultation without rales, wheezing or rhonchi  ABDOMEN: Soft, non-tender, non-distended MUSCULOSKELETAL:  No edema; No deformity  SKIN: Warm and dry NEUROLOGIC:  Alert and oriented x 3 PSYCHIATRIC:  Normal affect     Signed, Christy More, MD  09/11/2018 2:48 PM    [place]

## 2018-09-11 ENCOUNTER — Ambulatory Visit: Payer: BC Managed Care – PPO | Admitting: Cardiology

## 2018-09-11 ENCOUNTER — Encounter: Payer: Self-pay | Admitting: Cardiology

## 2018-09-11 VITALS — BP 144/96 | HR 102 | Ht 64.5 in | Wt 168.6 lb

## 2018-09-11 DIAGNOSIS — J4520 Mild intermittent asthma, uncomplicated: Secondary | ICD-10-CM | POA: Diagnosis not present

## 2018-09-11 DIAGNOSIS — E039 Hypothyroidism, unspecified: Secondary | ICD-10-CM

## 2018-09-11 DIAGNOSIS — I10 Essential (primary) hypertension: Secondary | ICD-10-CM | POA: Diagnosis not present

## 2018-09-11 DIAGNOSIS — I493 Ventricular premature depolarization: Secondary | ICD-10-CM | POA: Diagnosis not present

## 2018-09-11 DIAGNOSIS — R079 Chest pain, unspecified: Secondary | ICD-10-CM

## 2018-09-11 DIAGNOSIS — R002 Palpitations: Secondary | ICD-10-CM

## 2018-09-11 DIAGNOSIS — R0602 Shortness of breath: Secondary | ICD-10-CM

## 2018-09-11 MED ORDER — SPIRONOLACTONE 25 MG PO TABS: 25.0000 mg | ORAL_TABLET | Freq: Every day | ORAL | 1 refills | Status: DC

## 2018-09-11 NOTE — Patient Instructions (Addendum)
Medication Instructions:  Your physician has recommended you make the following change in your medication: START taking spironolactone 25 mg (1 tablet) daily  If you need a refill on your cardiac medications before your next appointment, please call your pharmacy.   Lab work: Your physician recommends that you have a ProBNP and D-Dimer drawn today.  If you have labs (blood work) drawn today and your tests are completely normal, you will receive your results only by: Marland Kitchen MyChart Message (if you have MyChart) OR . A paper copy in the mail If you have any lab test that is abnormal or we need to change your treatment, we will call you to review the results.  Testing/Procedures:  An EKG was performed   Your physician has requested that you have an echocardiogram. Echocardiography is a painless test that uses sound waves to create images of your heart. It provides your doctor with information about the size and shape of your heart and how well your heart's chambers and valves are working. This procedure takes approximately one hour. There are no restrictions for this procedure.  Your physician has requested that you have en exercise stress myoview. For further information please visit [email or web address]. Please follow instruction sheet, as given.  Your physician has recommended that you wear a ZIO monitor. ZIO monitors are medical devices that record the heart's electrical activity. Doctors most often use these monitors to diagnose arrhythmias. Arrhythmias are problems with the speed or rhythm of the heartbeat. The monitor is a small, portable device. You can wear one while you do your normal daily activities. This is usually used to diagnose what is causing palpitations/syncope (passing out). You will wear this device for 3 days.     Follow-Up: At [facility], you and your health needs are our priority.  As part of our continuing mission to provide you with exceptional heart care, we have  created designated Provider Care Teams.  These Care Teams include your primary Cardiologist (physician) and Advanced Practice Providers (APPs -  Physician Assistants and Nurse Practitioners) who all work together to provide you with the care you need, when you need it. You will need a follow up appointment in 4 weeks.     Any Other Special Instructions Will Be Listed Below     1. Avoid all over-the-counter antihistamines except Claritin/Loratadine and Zyrtec/Cetrizine. 2. Avoid all combination including cold sinus allergies flu decongestant and sleep medications 3. You can use Robitussin DM Mucinex and Mucinex DM for cough. 4. can use Tylenol aspirin ibuprofen and naproxen but no combinations such as sleep or sinus.   Echocardiogram An echocardiogram is a procedure that uses painless sound waves (ultrasound) to produce an image of the heart. Images from an echocardiogram can provide important information about:  Signs of coronary artery disease (CAD).  Aneurysm detection. An aneurysm is a weak or damaged part of an artery wall that bulges out from the normal force of blood pumping through the body.  Heart size and shape. Changes in the size or shape of the heart can be associated with certain conditions, including heart failure, aneurysm, and CAD.  Heart muscle function.  Heart valve function.  Signs of a past heart attack.  Fluid buildup around the heart.  Thickening of the heart muscle.  A tumor or infectious growth around the heart valves. Tell a health care provider about:  Any allergies you have.  All medicines you are taking, including vitamins, herbs, eye drops, creams, and over-the-counter medicines.  Any blood  disorders you have.  Any surgeries you have had.  Any medical conditions you have.  Whether you are pregnant or may be pregnant. What are the risks? Generally, this is a safe procedure. However, problems may occur, including:  Allergic reaction to dye  (contrast) that may be used during the procedure. What happens before the procedure? No specific preparation is needed. You may eat and drink normally. What happens during the procedure?   An IV tube may be inserted into one of your veins.  You may receive contrast through this tube. A contrast is an injection that improves the quality of the pictures from your heart.  A gel will be applied to your chest.  A wand-like tool (transducer) will be moved over your chest. The gel will help to transmit the sound waves from the transducer.  The sound waves will harmlessly bounce off of your heart to allow the heart images to be captured in real-time motion. The images will be recorded on a computer. The procedure may vary among health care providers and hospitals. What happens after the procedure?  You may return to your normal, everyday life, including diet, activities, and medicines, unless your health care provider tells you not to do that. Summary  An echocardiogram is a procedure that uses painless sound waves (ultrasound) to produce an image of the heart.  Images from an echocardiogram can provide important information about the size and shape of your heart, heart muscle function, heart valve function, and fluid buildup around your heart.  You do not need to do anything to prepare before this procedure. You may eat and drink normally.  After the echocardiogram is completed, you may return to your normal, everyday life, unless your health care provider tells you not to do that. This information is not intended to replace advice given to you by your health care provider. Make sure you discuss any questions you have with your health care provider. Document Released: 07/07/2000 Document Revised: 08/12/2016 Document Reviewed: 08/12/2016 Elsevier Interactive Patient Education  2019 [employer]. Exercise Stress Test An exercise stress test is a test to check how your heart works during  exercise. You will need to walk on a treadmill or ride an exercise bike for this test. An electrocardiogram (ECG) will record your heartbeat when you are at rest and when you are exercising. You may have an ultrasound or nuclear test after the exercise test. The test is done to check for coronary artery disease (CAD). It is also done to:  See how well you can exercise.  Watch for high blood pressure during exercise.  Test how well you can exercise after treatment.  Check the blood flow to your arms and legs. If your test result is not normal, more testing may be needed. What happens before the procedure?  Follow instructions from your doctor about what you cannot eat or drink. ? Do not have any drinks or foods that have caffeine in them for 24 hours before the test, or as told by your doctor. This includes coffee, tea (even decaf tea), sodas, chocolate, and cocoa.  Ask your doctor about changing or stopping your normal medicines. This is important if you: ? Take diabetes medicines. ? Take beta-blocker medicines. ? Wear a nitroglycerin patch.  If you use an inhaler, bring it with you to the test.  Do not put lotions, powders, creams, or oils on your chest before the test.  Wear comfortable shoes and clothing.  Do not use any products  that have nicotine or tobacco in them, such as cigarettes and e-cigarettes. Stop using them at least 4 hours before the test. If you need help quitting, ask your doctor. What happens during the procedure?  Patches (electrodes) will be put on your chest.  Wires will be connected to the patches. The wires will send signals to a machine to record your heartbeat.  Your heart rate will be watched while you are resting and while you are exercising. Your blood pressure will also be watched during the test.  You will walk on a treadmill or use a stationary bike. If you cannot use these, you may be asked to turn a crank with your hands.  The activity will get  harder and will raise your heart rate.  You may be asked to breathe into a tube a few times during the test. This measures the gases that you breathe out.  You will be asked how you are feeling throughout the test.  You will exercise until your heart reaches a target heart rate. You will stop early if: ? You feel dizzy. ? You have chest pain. ? You are out of breath. ? Your blood pressure is too high or too low. ? You have an irregular heartbeat. ? You have pain or aching in your arms or legs. The procedure may vary among doctors and hospitals. What happens after the procedure?  Your blood pressure, heart rate, breathing rate, and blood oxygen level will be watched after the test.  You may return to your normal diet and activities as told by your doctor.  It is up to you to get the results of your test. Ask your doctor, or the department that is doing the test, when your results will be ready. Summary  An exercise stress test is a test to check how your heart works during exercise.  This test is done to check for coronary artery disease.  Your heart rate will be watched while you are resting and while you are exercising.  Follow instructions from your doctor about what you cannot eat or drink before the test. This information is not intended to replace advice given to you by your health care provider. Make sure you discuss any questions you have with your health care provider. Document Released: 12/27/2007 Document Revised: 10/10/2016 Document Reviewed: 10/10/2016 Elsevier Interactive Patient Education  2019 [employer].

## 2018-09-12 LAB — D-DIMER, QUANTITATIVE: D-DIMER: 0.36 mg/L FEU (ref 0.00–0.49)

## 2018-09-12 LAB — PRO B NATRIURETIC PEPTIDE: NT-Pro BNP: 87 pg/mL (ref 0–287)

## 2018-09-23 ENCOUNTER — Ambulatory Visit (INDEPENDENT_AMBULATORY_CARE_PROVIDER_SITE_OTHER): Payer: BC Managed Care – PPO

## 2018-09-23 DIAGNOSIS — R079 Chest pain, unspecified: Secondary | ICD-10-CM | POA: Diagnosis not present

## 2018-09-23 DIAGNOSIS — R0602 Shortness of breath: Secondary | ICD-10-CM | POA: Diagnosis not present

## 2018-09-23 DIAGNOSIS — R002 Palpitations: Secondary | ICD-10-CM

## 2018-09-23 DIAGNOSIS — I493 Ventricular premature depolarization: Secondary | ICD-10-CM | POA: Diagnosis not present

## 2018-09-23 NOTE — Progress Notes (Signed)
Complete echocardiogram has been performed.  Jimmy Ladarien Beeks RDCS, RVT 

## 2018-09-26 ENCOUNTER — Encounter: Payer: Self-pay | Admitting: *Deleted

## 2018-09-26 ENCOUNTER — Telehealth: Payer: Self-pay | Admitting: *Deleted

## 2018-09-26 DIAGNOSIS — I493 Ventricular premature depolarization: Secondary | ICD-10-CM

## 2018-09-26 DIAGNOSIS — Z01812 Encounter for preprocedural laboratory examination: Secondary | ICD-10-CM

## 2018-09-26 DIAGNOSIS — I10 Essential (primary) hypertension: Secondary | ICD-10-CM

## 2018-09-26 NOTE — Telephone Encounter (Signed)
Dr. Bettina Gavia advised for patient's exercise myoview to be cancelled and to proceed with a heart catheterization after viewing patient's echocardiogram results from 09/23/2018. Dr. Bettina Gavia spoke with patient on the phone and reviewed this procedure with her yesterday evening. Patient is agreeable. Patient will keep monitor appointment on 10/15/2018 as previously planned per Dr. Bettina Gavia.   Left message for patient to return call to discuss preferred date and time for her heart catheterization to be scheduled.

## 2018-09-26 NOTE — Addendum Note (Signed)
Addended by: Shirlee More on: 09/26/2018 08:58 AM   Modules accepted: Orders, SmartSet

## 2018-09-27 NOTE — Addendum Note (Signed)
Addended by: Shirlee More on: 09/27/2018 07:30 AM   Modules accepted: Orders

## 2018-09-28 LAB — BASIC METABOLIC PANEL
BUN/Creatinine Ratio: 24 (ref 12–28)
BUN: 19 mg/dL (ref 8–27)
CO2: 24 mmol/L (ref 20–29)
Calcium: 9.5 mg/dL (ref 8.7–10.3)
Chloride: 104 mmol/L (ref 96–106)
Creatinine, Ser: 0.8 mg/dL (ref 0.57–1.00)
GFR calc Af Amer: 90 mL/min/{1.73_m2} (ref >59)
GFR calc non Af Amer: 78 mL/min/{1.73_m2} (ref >59)
Glucose: 113 mg/dL — ABNORMAL HIGH (ref 65–99)
Potassium: 4.2 mmol/L (ref 3.5–5.2)
Sodium: 142 mmol/L (ref 134–144)

## 2018-09-28 LAB — CBC
Hematocrit: 40.4 % (ref 34.0–46.6)
Hemoglobin: 14 g/dL (ref 11.1–15.9)
MCH: 29.8 pg (ref 26.6–33.0)
MCHC: 34.7 g/dL (ref 31.5–35.7)
MCV: 86 fL (ref 79–97)
Platelets: 301 10*3/uL (ref 150–450)
RBC: 4.7 x10E6/uL (ref 3.77–5.28)
RDW: 12 % (ref 11.7–15.4)
WBC: 6.9 10*3/uL (ref 3.4–10.8)

## 2018-09-30 ENCOUNTER — Telehealth: Payer: Self-pay | Admitting: *Deleted

## 2018-09-30 NOTE — Telephone Encounter (Signed)
Pt contacted pre-catheterization scheduled at [facility] for: Tuesday October 01, 2018 9 AM Verified arrival time and place: [place] Entrance A at: 7 AM  No solid food after midnight prior to cath, clear liquids until 5 AM day of procedure. Contrast allergy: no  Hold: Spironolactone-AM of procedure.  Except hold medications AM meds can be  taken pre-cath with sip of water including: ASA 81 mg   Confirm patient has responsible person to drive home post procedure and observe 24 hours after arriving home:  LMTCB to review instructions with patient.

## 2018-09-30 NOTE — Telephone Encounter (Signed)
Reviewed instructions with patient, she verbalized understanding, thanked me for call.

## 2018-10-01 ENCOUNTER — Ambulatory Visit (HOSPITAL_COMMUNITY)
Admission: RE | Admit: 2018-10-01 | Discharge: 2018-10-01 | Disposition: A | Discharge status: Home / Self Care | Payer: BC Managed Care – PPO | Attending: Cardiovascular Disease | Admitting: Cardiovascular Disease

## 2018-10-01 ENCOUNTER — Other Ambulatory Visit: Payer: Self-pay

## 2018-10-01 ENCOUNTER — Encounter (HOSPITAL_COMMUNITY)
Admission: RE | Disposition: A | Discharge status: Home / Self Care | Payer: Self-pay | Source: Home / Self Care | Attending: Cardiovascular Disease

## 2018-10-01 ENCOUNTER — Encounter (HOSPITAL_COMMUNITY): Payer: Self-pay | Admitting: Cardiovascular Disease

## 2018-10-01 DIAGNOSIS — J4520 Mild intermittent asthma, uncomplicated: Secondary | ICD-10-CM | POA: Insufficient documentation

## 2018-10-01 DIAGNOSIS — E039 Hypothyroidism, unspecified: Secondary | ICD-10-CM | POA: Insufficient documentation

## 2018-10-01 DIAGNOSIS — I493 Ventricular premature depolarization: Secondary | ICD-10-CM | POA: Diagnosis not present

## 2018-10-01 DIAGNOSIS — I429 Cardiomyopathy, unspecified: Secondary | ICD-10-CM | POA: Diagnosis not present

## 2018-10-01 DIAGNOSIS — K219 Gastro-esophageal reflux disease without esophagitis: Secondary | ICD-10-CM | POA: Diagnosis not present

## 2018-10-01 DIAGNOSIS — R0789 Other chest pain: Secondary | ICD-10-CM | POA: Insufficient documentation

## 2018-10-01 DIAGNOSIS — Z79899 Other long term (current) drug therapy: Secondary | ICD-10-CM | POA: Insufficient documentation

## 2018-10-01 DIAGNOSIS — Z888 Allergy status to other drugs, medicaments and biological substances status: Secondary | ICD-10-CM | POA: Insufficient documentation

## 2018-10-01 DIAGNOSIS — Z885 Allergy status to narcotic agent status: Secondary | ICD-10-CM | POA: Diagnosis not present

## 2018-10-01 DIAGNOSIS — I10 Essential (primary) hypertension: Secondary | ICD-10-CM | POA: Diagnosis not present

## 2018-10-01 DIAGNOSIS — R079 Chest pain, unspecified: Secondary | ICD-10-CM

## 2018-10-01 HISTORY — PX: LEFT HEART CATH AND CORONARY ANGIOGRAPHY: CATH118249

## 2018-10-01 SURGERY — LEFT HEART CATH AND CORONARY ANGIOGRAPHY
Anesthesia: LOCAL

## 2018-10-01 MED ORDER — VERAPAMIL HCL 2.5 MG/ML IV SOLN
INTRAVENOUS | Status: DC | PRN
  Administered 2018-10-01: 10 mL via INTRA_ARTERIAL

## 2018-10-01 MED ORDER — FENTANYL CITRATE (PF) 100 MCG/2ML IJ SOLN
INTRAMUSCULAR | Status: AC
  Filled 2018-10-01: qty 2

## 2018-10-01 MED ORDER — HEPARIN (PORCINE) IN NACL 1000-0.9 UT/500ML-% IV SOLN
INTRAVENOUS | Status: DC | PRN
  Administered 2018-10-01: 500 mL

## 2018-10-01 MED ORDER — ACETAMINOPHEN 325 MG PO TABS: 650.0000 mg | ORAL_TABLET | ORAL | Status: DC | PRN

## 2018-10-01 MED ORDER — HEPARIN (PORCINE) IN NACL 1000-0.9 UT/500ML-% IV SOLN
INTRAVENOUS | Status: AC
  Filled 2018-10-01: qty 1000

## 2018-10-01 MED ORDER — SODIUM CHLORIDE 0.9 % IV SOLN: 250.0000 mL | INTRAVENOUS | Status: DC | PRN

## 2018-10-01 MED ORDER — SODIUM CHLORIDE 0.9% FLUSH: 3.0000 mL | Freq: Two times a day (BID) | INTRAVENOUS | Status: DC

## 2018-10-01 MED ORDER — SODIUM CHLORIDE 0.9 % IV SOLN: INTRAVENOUS | Status: DC

## 2018-10-01 MED ORDER — HEPARIN SODIUM (PORCINE) 1000 UNIT/ML IJ SOLN
INTRAMUSCULAR | Status: AC
  Filled 2018-10-01: qty 1

## 2018-10-01 MED ORDER — HEPARIN SODIUM (PORCINE) 1000 UNIT/ML IJ SOLN
INTRAMUSCULAR | Status: DC | PRN
  Administered 2018-10-01: 3500 [IU] via INTRAVENOUS

## 2018-10-01 MED ORDER — MIDAZOLAM HCL 2 MG/2ML IJ SOLN
INTRAMUSCULAR | Status: AC
  Filled 2018-10-01: qty 2

## 2018-10-01 MED ORDER — LIDOCAINE HCL (PF) 1 % IJ SOLN
INTRAMUSCULAR | Status: AC
  Filled 2018-10-01: qty 30

## 2018-10-01 MED ORDER — IOHEXOL 350 MG/ML SOLN
INTRAVENOUS | Status: DC | PRN
  Administered 2018-10-01: 55 mL via INTRA_ARTERIAL

## 2018-10-01 MED ORDER — ONDANSETRON HCL 4 MG/2ML IJ SOLN: 4.0000 mg | Freq: Four times a day (QID) | INTRAMUSCULAR | Status: DC | PRN

## 2018-10-01 MED ORDER — ASPIRIN 81 MG PO CHEW: 81.0000 mg | CHEWABLE_TABLET | Freq: Every day | ORAL | Status: DC

## 2018-10-01 MED ORDER — VERAPAMIL HCL 2.5 MG/ML IV SOLN
INTRAVENOUS | Status: AC
  Filled 2018-10-01: qty 2

## 2018-10-01 MED ORDER — MIDAZOLAM HCL 2 MG/2ML IJ SOLN
INTRAMUSCULAR | Status: DC | PRN
  Administered 2018-10-01: 2 mg via INTRAVENOUS
  Administered 2018-10-01: 1 mg via INTRAVENOUS

## 2018-10-01 MED ORDER — DIAZEPAM 5 MG PO TABS: 5.0000 mg | ORAL_TABLET | Freq: Four times a day (QID) | ORAL | Status: DC | PRN

## 2018-10-01 MED ORDER — SODIUM CHLORIDE 0.9% FLUSH: 3.0000 mL | INTRAVENOUS | Status: DC | PRN

## 2018-10-01 MED ORDER — LIDOCAINE HCL (PF) 1 % IJ SOLN
INTRAMUSCULAR | Status: DC | PRN
  Administered 2018-10-01: 2 mL

## 2018-10-01 MED ORDER — FENTANYL CITRATE (PF) 100 MCG/2ML IJ SOLN
INTRAMUSCULAR | Status: DC | PRN
  Administered 2018-10-01 (×2): 25 ug via INTRAVENOUS

## 2018-10-01 MED ORDER — SODIUM CHLORIDE 0.9 % WEIGHT BASED INFUSION: 1.0000 mL/kg/h | INTRAVENOUS | Status: DC

## 2018-10-01 MED ORDER — SODIUM CHLORIDE 0.9 % WEIGHT BASED INFUSION
3.0000 mL/kg/h | INTRAVENOUS | Status: AC
  Administered 2018-10-01: 3 mL/kg/h via INTRAVENOUS

## 2018-10-01 MED ORDER — ASPIRIN 81 MG PO CHEW: 81.0000 mg | CHEWABLE_TABLET | ORAL | Status: DC

## 2018-10-01 SURGICAL SUPPLY — 11 items

## 2018-10-01 NOTE — Discharge Instructions (Signed)
Radial Site Care ° °This sheet gives you information about how to care for yourself after your procedure. Your health care provider may also give you more specific instructions. If you have problems or questions, contact your health care provider. °What can I expect after the procedure? °After the procedure, it is common to have: °· Bruising and tenderness at the catheter insertion area. °Follow these instructions at home: °Medicines °· Take over-the-counter and prescription medicines only as told by your health care provider. °Insertion site care °· Follow instructions from your health care provider about how to take care of your insertion site. Make sure you: °? Wash your hands with soap and water before you change your bandage (dressing). If soap and water are not available, use hand sanitizer. °? Change your dressing as told by your health care provider. °? Leave stitches (sutures), skin glue, or adhesive strips in place. These skin closures may need to stay in place for 2 weeks or longer. If adhesive strip edges start to loosen and curl up, you may trim the loose edges. Do not remove adhesive strips completely unless your health care provider tells you to do that. °· Check your insertion site every day for signs of infection. Check for: °? Redness, swelling, or pain. °? Fluid or blood. °? Pus or a bad smell. °? Warmth. °· Do not take baths, swim, or use a hot tub until your health care provider approves. °· You may shower 24-48 hours after the procedure, or as directed by your health care provider. °? Remove the dressing and gently wash the site with plain soap and water. °? Pat the area dry with a clean towel. °? Do not rub the site. That could cause bleeding. °· Do not apply powder or lotion to the site. °Activity ° °· For 24 hours after the procedure, or as directed by your health care provider: °? Do not flex or bend the affected arm. °? Do not push or pull heavy objects with the affected arm. °? Do not  drive yourself home from the hospital or clinic. You may drive 24 hours after the procedure unless your health care provider tells you not to. °? Do not operate machinery or power tools. °· Do not lift anything that is heavier than 10 lb (4.5 kg), or the limit that you are told, until your health care provider says that it is safe. °· Ask your health care provider when it is okay to: °? Return to work or school. °? Resume usual physical activities or sports. °? Resume sexual activity. °General instructions °· If the catheter site starts to bleed, raise your arm and put firm pressure on the site. If the bleeding does not stop, get help right away. This is a medical emergency. °· If you went home on the same day as your procedure, a responsible adult should be with you for the first 24 hours after you arrive home. °· Keep all follow-up visits as told by your health care provider. This is important. °Contact a health care provider if: °· You have a fever. °· You have redness, swelling, or yellow drainage around your insertion site. °Get help right away if: °· You have unusual pain at the radial site. °· The catheter insertion area swells very fast. °· The insertion area is bleeding, and the bleeding does not stop when you hold steady pressure on the area. °· Your arm or hand becomes pale, cool, tingly, or numb. °These symptoms may represent a serious problem   that is an emergency. Do not wait to see if the symptoms will go away. Get medical help right away. Call your local emergency services (911 in the [place] Do not drive yourself to the hospital. °Summary °· After the procedure, it is common to have bruising and tenderness at the site. °· Follow instructions from your health care provider about how to take care of your radial site wound. Check the wound every day for signs of infection. °· Do not lift anything that is heavier than 10 lb (4.5 kg), or the limit that you are told, until your health care provider says  that it is safe. °This information is not intended to replace advice given to you by your health care provider. Make sure you discuss any questions you have with your health care provider. °Document Released: 08/12/2010 Document Revised: 08/15/2017 Document Reviewed: 08/15/2017 °Elsevier Interactive Patient Education © 2019 [employer] ° °

## 2018-10-01 NOTE — Interval H&P Note (Signed)
Cath Lab Visit (complete for each Cath Lab visit)  Clinical Evaluation Leading to the Procedure:   ACS: No.  Non-ACS:    Anginal Classification: CCS II  Anti-ischemic medical therapy: Minimal Therapy (1 class of medications)  Non-Invasive Test Results: No non-invasive testing performed  Prior CABG: No previous CABG      History and Physical Interval Note:  10/01/2018 10:10 AM  Christy Mccoy  has presented today for surgery, with the diagnosis of Chest Pain.  The various methods of treatment have been discussed with the patient and family. After consideration of risks, benefits and other options for treatment, the patient has consented to  Procedure(s): LEFT HEART CATH AND CORONARY ANGIOGRAPHY (N/A) as a surgical intervention.  The patient's history has been reviewed, patient examined, no change in status, stable for surgery.  I have reviewed the patient's chart and labs.  Questions were answered to the patient's satisfaction.     Shelva Majestic

## 2018-10-02 ENCOUNTER — Telehealth: Payer: Self-pay | Admitting: Cardiology

## 2018-10-02 NOTE — Telephone Encounter (Signed)
Please advise. Thanks.  

## 2018-10-02 NOTE — Telephone Encounter (Signed)
Let pt know Dr. Bettina Gavia advised no changes in medication and to keep appt to see him and discuss treatment plan for future.

## 2018-10-02 NOTE — Telephone Encounter (Signed)
I was pleased with the results I think it is reassuring however I would continue the current medications particularly for treatment of high blood pressure and I would like her to come the office in follow-up so we can review the test and understand treatment from this point forward

## 2018-10-02 NOTE — Telephone Encounter (Signed)
Patient hd normal cath yesterday and had no blockages and she is wondering if she is to continue fhe medicine and does she need to be seen sooner than the 30th (cath lab ssaid w/i 1 week).

## 2018-10-04 ENCOUNTER — Encounter (HOSPITAL_COMMUNITY): Payer: Self-pay | Admitting: Cardiovascular Disease

## 2018-10-14 ENCOUNTER — Telehealth: Payer: Self-pay | Admitting: *Deleted

## 2018-10-14 NOTE — Telephone Encounter (Signed)
Cancelled appt to have monitor put on and will enroll pt and co will mail out monitor to pt. Will enroll pt and monitor will be mailed out this week.

## 2018-10-14 NOTE — Telephone Encounter (Signed)
Left message for pt to call us back and let us know if it is okay to have her monitor mailed to her home.

## 2018-10-18 ENCOUNTER — Other Ambulatory Visit (INDEPENDENT_AMBULATORY_CARE_PROVIDER_SITE_OTHER): Payer: BC Managed Care – PPO

## 2018-10-18 DIAGNOSIS — R079 Chest pain, unspecified: Secondary | ICD-10-CM

## 2018-10-18 DIAGNOSIS — R002 Palpitations: Secondary | ICD-10-CM

## 2018-10-18 DIAGNOSIS — I493 Ventricular premature depolarization: Secondary | ICD-10-CM | POA: Diagnosis not present

## 2018-10-18 DIAGNOSIS — R0602 Shortness of breath: Secondary | ICD-10-CM | POA: Diagnosis not present

## 2018-10-21 ENCOUNTER — Other Ambulatory Visit: Payer: Self-pay

## 2018-10-21 ENCOUNTER — Telehealth: Payer: BC Managed Care – PPO | Admitting: Cardiology

## 2018-10-30 ENCOUNTER — Telehealth: Payer: Self-pay | Admitting: Cardiology

## 2018-10-30 NOTE — Telephone Encounter (Signed)
Cardiac Questionnaire:    Since your last visit or hospitalization:    1. Have you been having new or worsening chest pain? no   2. Have you been having new or worsening shortness of breath? no 3. Have you been having new or worsening leg swelling, wt gain, or increase in abdominal girth (pants fitting more tightly)? no   4. Have you had any passing out spells? no    *A YES to any of these questions would result in the appointment being kept. *If all the answers to these questions are NO, we should indicate that given the current situation regarding the worldwide coronarvirus pandemic, at the recommendation of the CDC, we are looking to limit gatherings in our waiting area, and thus will reschedule their appointment beyond four weeks from today.   _____________   AJOIN-86 Pre-Screening Questions:   Do you currently have a fever? no (yes = cancel and refer to pcp for e-visit)  Have you recently travelled on a cruise, internationally, or to [place], [place], [place], [place], [place], or [place], [place]) ? no (yes = cancel, stay home, monitor symptoms, and contact pcp or initiate e-visit if symptoms develop)  Have you been in contact with someone that is currently pending confirmation of Covid19 testing or has been confirmed to have the [place] virus?  no (yes = cancel, stay home, away from tested individual, monitor symptoms, and contact pcp or initiate e-visit if symptoms develop)  Are you currently experiencing fatigue or cough? no (yes = pt should be prepared to have a mask placed at the time of their visit).      Virtual Visit Pre-Appointment Phone Call  Steps For Call:  1. Confirm consent - "In the setting of the current Covid19 crisis, you are scheduled for a (phone or video) visit with your provider on (date) at (time).  Just as we do with many in-office visits, in order for you to participate in this visit, we must obtain consent.  If you'd like, I can send this to your mychart (if  signed up) or email for you to review.  Otherwise, I can obtain your verbal consent now.  All virtual visits are billed to your insurance company just like a normal visit would be.  By agreeing to a virtual visit, we'd like you to understand that the technology does not allow for your provider to perform an examination, and thus may limit your provider's ability to fully assess your condition.  Finally, though the technology is pretty good, we cannot assure that it will always work on either your or our end, and in the setting of a video visit, we may have to convert it to a phone-only visit.  In either situation, we cannot ensure that we have a secure connection.  Are you willing to proceed?"  2. Give patient instructions for WebEx download to smartphone as below if video visit  3. Advise patient to be prepared with any vital sign or heart rhythm information, their current medicines, and a piece of paper and pen handy for any instructions they may receive the day of their visit  4. Inform patient they will receive a phone call 15 minutes prior to their appointment time (may be from unknown caller ID) so they should be prepared to answer  5. Confirm that appointment type is correct in Epic appointment notes (video vs telephone)    TELEPHONE CALL NOTE  Christy Mccoy has been deemed a candidate for a follow-up tele-health  visit to limit community exposure during the Covid-19 pandemic. I spoke with the patient via phone to ensure availability of phone/video source, confirm preferred email & phone number, and discuss instructions and expectations.  I reminded Christy Mccoy to be prepared with any vital sign and/or heart rhythm information that could potentially be obtained via home monitoring, at the time of her visit. I reminded Christy Mccoy to expect a phone call at the time of her visit if her visit.  Did the patient verbally acknowledge consent to treatment? TES/verbal  Isaiah Blakes 10/30/2018 11:33 AM   DOWNLOADING THE WEBEX SOFTWARE TO SMARTPHONE  - If Apple, go to [employer] and type in WebEx in the search bar. [employer], the blue/green circle. The app is free but as with any other app downloads, their phone may require them to verify saved payment information or Apple password. The patient does NOT have to create an account.  - If Android, ask patient to go to [employer] and type in WebEx in the search bar. [place] [employer], the blue/green circle. The app is free but as with any other app downloads, their phone may require them to verify saved payment information or Android password. The patient does NOT have to create an account.   CONSENT FOR TELE-HEALTH VISIT - PLEASE REVIEW  I hereby voluntarily request, consent and authorize [facility] and its employed or contracted physicians, physician assistants, nurse practitioners or other licensed health care professionals (the Practitioner), to provide me with telemedicine health care services (the Services") as deemed necessary by the treating Practitioner. I acknowledge and consent to receive the Services by the Practitioner via telemedicine. I understand that the telemedicine visit will involve communicating with the Practitioner through live audiovisual communication technology and the disclosure of certain medical information by electronic transmission. I acknowledge that I have been given the opportunity to request an in-person assessment or other available alternative prior to the telemedicine visit and am voluntarily participating in the telemedicine visit.  I understand that I have the right to withhold or withdraw my consent to the use of telemedicine in the course of my care at any time, without affecting my right to future care or treatment, and that the Practitioner or I may terminate the telemedicine visit at any time. I understand that I have the right to inspect  all information obtained and/or recorded in the course of the telemedicine visit and may receive copies of available information for a reasonable fee.  I understand that some of the potential risks of receiving the Services via telemedicine include:   Delay or interruption in medical evaluation due to technological equipment failure or disruption;  Information transmitted may not be sufficient (e.g. poor resolution of images) to allow for appropriate medical decision making by the Practitioner; and/or   In rare instances, security protocols could fail, causing a breach of personal health information.  Furthermore, I acknowledge that it is my responsibility to provide information about my medical history, conditions and care that is complete and accurate to the best of my ability. I acknowledge that Practitioner's advice, recommendations, and/or decision may be based on factors not within their control, such as incomplete or inaccurate data provided by me or distortions of diagnostic images or specimens that may result from electronic transmissions. I understand that the practice of medicine is not an exact science and that Practitioner makes no warranties or guarantees regarding treatment outcomes. I acknowledge that I  will receive a copy of this consent concurrently upon execution via email to the email address I last provided but may also request a printed copy by calling the office of [facility].    I understand that my insurance will be billed for this visit.   I have read or had this consent read to me.  I understand the contents of this consent, which adequately explains the benefits and risks of the Services being provided via telemedicine.   I have been provided ample opportunity to ask questions regarding this consent and the Services and have had my questions answered to my satisfaction.  I give my informed consent for the services to be provided through the use of telemedicine in my  medical care  By participating in this telemedicine visit I agree to the above.

## 2018-11-04 ENCOUNTER — Other Ambulatory Visit: Payer: Self-pay | Admitting: Cardiology

## 2018-11-05 ENCOUNTER — Encounter: Payer: Self-pay | Admitting: Cardiology

## 2018-11-05 ENCOUNTER — Other Ambulatory Visit: Payer: Self-pay

## 2018-11-05 ENCOUNTER — Telehealth (INDEPENDENT_AMBULATORY_CARE_PROVIDER_SITE_OTHER): Payer: BC Managed Care – PPO | Admitting: Cardiology

## 2018-11-05 VITALS — BP 129/79 | HR 81 | Ht 64.5 in | Wt 166.0 lb

## 2018-11-05 DIAGNOSIS — I119 Hypertensive heart disease without heart failure: Secondary | ICD-10-CM | POA: Diagnosis not present

## 2018-11-05 DIAGNOSIS — I491 Atrial premature depolarization: Secondary | ICD-10-CM

## 2018-11-05 DIAGNOSIS — Z7189 Other specified counseling: Secondary | ICD-10-CM

## 2018-11-05 DIAGNOSIS — J4520 Mild intermittent asthma, uncomplicated: Secondary | ICD-10-CM

## 2018-11-05 DIAGNOSIS — I493 Ventricular premature depolarization: Secondary | ICD-10-CM

## 2018-11-05 HISTORY — DX: Other specified counseling: Z71.89

## 2018-11-05 HISTORY — DX: Atrial premature depolarization: I49.1

## 2018-11-05 NOTE — Patient Instructions (Signed)
Medication Instructions:  Your physician recommends that you continue on your current medications as directed. Please refer to the Current Medication list given to you today.  If you need a refill on your cardiac medications before your next appointment, please call your pharmacy.   Lab work: None If you have labs (blood work) drawn today and your tests are completely normal, you will receive your results only by: Marland Kitchen MyChart Message (if you have MyChart) OR . A paper copy in the mail If you have any lab test that is abnormal or we need to change your treatment, we will call you to review the results.  Testing/Procedures: None  Follow-Up: At [facility], you and your health needs are our priority.  As part of our continuing mission to provide you with exceptional heart care, we have created designated Provider Care Teams.  These Care Teams include your primary Cardiologist (physician) and Advanced Practice Providers (APPs -  Physician Assistants and Nurse Practitioners) who all work together to provide you with the care you need, when you need it. You will need a follow up appointment in 1 years.  Please call our office 2 months in advance to schedule this appointment.  You may see No primary care provider on file. or another member of our [employer] Provider Team in [place]: Jenne Campus, MD . Jyl Heinz, MD  Any Other Special Instructions Will Be Listed Below (If Applicable).

## 2018-11-05 NOTE — Progress Notes (Signed)
Virtual Visit via Video Note   This visit type was conducted due to national recommendations for restrictions regarding the COVID-19 Pandemic (e.g. social distancing) in an effort to limit this patient's exposure and mitigate transmission in our community.  Due to her co-morbid illnesses, this patient is at least at moderate risk for complications without adequate follow up.  This format is felt to be most appropriate for this patient at this time.  All issues noted in this document were discussed and addressed.  A limited physical exam was performed with this format.  Please refer to the patient's chart for her consent to telehealth for [facility].   Evaluation Performed:  Follow-up visit  Date:  11/05/2018   ID:  Christy Mccoy, DOB 1956/02/22, MRN [id number]  Patient Location: Home  Provider Location: Office  PCP:  Algis Greenhouse, MD  Cardiologist:  No primary care provider on file. Dr Bettina Gavia Electrophysiologist:  None   Chief Complaint:  FU after cardiac testing for hypertension chest pain and palpitation with PVC's  History of Present Illness:    Christy Mccoy is a 63 y.o. female who presents via audio/video conferencing for a telehealth visit today.    Christy Mccoy is a 63 y.o. female who is being seen today for the evaluation of palpitation at the request of Dough, Jaymes Graff, MD. She has a history of hypertension PVCs and was worsened in the setting of acute sinusitis.   She had echocardiogram performed 09/23/2018 showed mild concentric LVH ejection fraction was mildly diminished 45 to 50% with mild left atrial enlargement.  In view of her symptoms abnormal echocardiogram and after a nice discussion with patient and risks benefits and options she underwent coronary angiography at [facility].  She had minimal luminal atherosclerosis however the quality of the ventriculogram was poor with a hand-held injection and I think her echocardiogram is a better reflection of her  hypertensive heart disease.  Her event monitor showed rare ventricular ectopy and supraventricular ectopy with brief runs of atrial premature contractions on treatment including her antihypertensives verapamil spironolactone.  We reviewed the results of her echocardiogram with hypertensive heart disease LVH mildly reduced ejection fraction left atrial enlargement left heart catheterization with minimal coronary atherosclerosis and her extended heart rhythm monitor.  LEFT HEART CATH AND CORONARY ANGIOGRAPHY  Conclusion 10/01/18    Ost 1st Diag lesion is 10% stenosed.   No significant coronary obstructive disease with minimal 10% luminal irregularity of the proximal diagonal branch of the LAD with an otherwise normal LAD, left circumflex, and dominant RCA.   Normal LV function with EF estimate approximately 55%.  LVEDP 10 mm.   RECOMMENDATION: Medical therapy.  Patient will follow-up with Dr. Shirlee More.   A ZIO monitor was performed for 3 days to assess PVCs and palpitation. The rhythm is sinus with minimum average and maximum heart rates of 52, 78 and 146 bpm. There were no pauses of 3 seconds or greater and no episodes of second or third-degree AV block or sinus node exit block.  The minimum rate was sinus bradycardia. Ventricular ectopy was rare with isolated PVCs and couplets. Supraventricular ectopy was rare, there were 7 runs of atrial premature contractions the longest and fastest 8 complexes at 138 bpm.  There were no episodes of atrial fibrillation or flutter. There were 7 triggered and 3 diary events predominantly associated with sinus rhythm but also associated with isolated APCs. Conclusion unremarkable 3-day ZIO monitor   The patient  does not have symptoms concerning for COVID-19 infection (fever, chills, cough, or new shortness of breath).   Not only does she feel well but she is now reassured that she does not have unrecognized coronary artery disease.  Her asthma is under  control blood pressure is well controlled no chest pain shortness of breath palpitation.  Past Medical History:  Diagnosis Date   Allergic rhinoconjunctivitis    Asthma    GERD (gastroesophageal reflux disease)    Hypertension    Hypothyroidism    PVC's (premature ventricular contractions)    Past Surgical History:  Procedure Laterality Date   ABDOMINAL HYSTERECTOMY  Age 89   CYST REMOVAL TRUNK     FINGER SURGERY Left    Left ring finger   HERNIA REPAIR     laparoscopic cholecystectomy     LEFT HEART CATH AND CORONARY ANGIOGRAPHY N/A 10/01/2018   Procedure: LEFT HEART CATH AND CORONARY ANGIOGRAPHY;  Surgeon: Troy Sine, MD;  Location: [facility];  Service: Cardiovascular;  Laterality: N/A;   OVARIAN CYST REMOVAL  Age 50     Current Meds  Medication Sig   Albuterol Sulfate (PROAIR RESPICLICK) 132 (90 Base) MCG/ACT AEPB Inhale 2 puffs into the lungs every 4 (four) hours as needed (wheezing/shortness of breath/cough).    Ascorbic Acid (VITAMIN C PO) Take 2 tablets by mouth daily as needed (for immune health). Gummies   beclomethasone (QVAR REDIHALER) 80 MCG/ACT inhaler Inhale 2 puffs into the lungs 2 (two) times daily.    BIOTIN PO Take 2 tablets by mouth daily. Gummies   cetirizine (ZYRTEC) 10 MG tablet Take 10 mg by mouth every evening.    Cyanocobalamin ([employer] B12 GUMMIES PO) Take 2 tablets by mouth daily with lunch.   estradiol (ESTRACE) 0.5 MG tablet Take 0.5 mg by mouth at bedtime.    famotidine (PEPCID) 40 MG tablet Take 1 tablet (40 mg total) by mouth nightly. reflux   fluticasone (FLONASE) 50 MCG/ACT nasal spray Place 1 spray into the nose daily as needed (allergies/congestion.).    ibuprofen (ADVIL,MOTRIN) 200 MG tablet Take 400 mg by mouth every 8 (eight) hours as needed (pain).   levothyroxine (SYNTHROID, LEVOTHROID) 88 MCG tablet Take 88 mcg by mouth daily before breakfast.    montelukast (SINGULAIR) 10 MG tablet Take 10 mg by mouth at  bedtime.    Multiple Vitamins-Minerals (ADULT GUMMY PO) Take 2 tablets by mouth daily with lunch.   omeprazole (PRILOSEC) 40 MG capsule TAKE ONE CAPSULE EACH MORNING BEFORE BREAKFAST   spironolactone (ALDACTONE) 25 MG tablet Take 1 tablet (25 mg total) by mouth daily. Take 1 tablet (25 mg total) daily with lunch   verapamil (VERELAN PM) 240 MG 24 hr capsule Take 240 mg by mouth daily.      Allergies:   Lisinopril and Codeine   Social History   Tobacco Use   Smoking status: Never Smoker   Smokeless tobacco: Never Used  Substance Use Topics   Alcohol use: Yes    Comment: social   Drug use: Never     Family Hx: The patient's family history includes Dementia in her father; Diabetes in her father; Heart attack in her paternal grandfather; Lymphoma in her mother; Stroke in her maternal grandmother and paternal grandmother.  ROS:   Please see the history of present illness.     All other systems reviewed and are negative.   Prior CV studies:   The following studies were reviewed today:    Labs/Other  Tests and Data Reviewed:    EKG:  No ECG reviewed.  Recent Labs: 09/11/18 D Dimer 0.36 mg/l 09/11/2018: NT-Pro BNP 87 09/27/2018: BUN 19; Creatinine, Ser 0.80; Hemoglobin 14.0; Platelets 301; Potassium 4.2; Sodium 142   Recent Lipid Panel No results found for: CHOL, TRIG, HDL, CHOLHDL, LDLCALC, LDLDIRECT  Wt Readings from Last 3 Encounters:  11/05/18 166 lb (75.3 kg)  10/01/18 164 lb (74.4 kg)  09/11/18 168 lb 9.6 oz (76.5 kg)     Objective:    Vital Signs:  BP 129/79 (BP Location: Left Arm, Patient Position: Sitting)    Pulse 81    Ht 5' 4.5" (1.638 m)    Wt 166 lb (75.3 kg)    BMI 28.05 kg/m    Constitutional, well-nourished well-developed in no acute distress Vital signs reviewed Eyes, conjunctiva and sclera are normal without pallor or icterus extraocular motions intact and normal there is no lid lag Respiratory, normal effort and excursion no audible wheezing  without a stethoscope Cardiovascular, no neck vein distention or peripheral edema Skin, no rash skin lesion or ulceration of the extremities Neurologic, cranial nerves II to XII are grossly intact and the patient moves all 4 extremities Neuro/Psychiatric, judgment and thought processes are intact and coherent, alert and oriented x3, mood and affect appear normal.  ASSESSMENT & PLAN:    1. Hypertensive heart disease stable blood pressure target and continue current treatment including distal diuretic and calcium channel blocker.  She understands the importance of and is committed to good blood pressure control including home monitoring sodium restriction follow-up in the office in 1 year and likely recheck an echocardiogram with my expectation she has normal left ventricular function.   2. PVCs improved worsened in the setting of uncontrolled hypertension and asthma. 3. APCs improved continue calcium channel blocker 4. Asthma is improved managed by her PCP continue current medications  COVID-19 Education: The signs and symptoms of COVID-19 were discussed with the patient and how to seek care for testing (follow up with PCP or arrange E-visit).  The importance of social distancing was discussed today.  Time:   Today, I have spent 25 minutes with the patient with telehealth technology discussing the above problems.     Medication Adjustments/Labs and Tests Ordered: Current medicines are reviewed at length with the patient today.  Concerns regarding medicines are outlined above.   Tests Ordered: No orders of the defined types were placed in this encounter.   Medication Changes: No orders of the defined types were placed in this encounter.   Disposition:  Follow up in 1 year(s)  Signed, Shirlee More, MD  11/05/2018 1:53 PM    [facility]

## 2019-01-02 ENCOUNTER — Other Ambulatory Visit: Payer: Self-pay | Admitting: Cardiology

## 2019-05-06 DIAGNOSIS — I2510 Atherosclerotic heart disease of native coronary artery without angina pectoris: Secondary | ICD-10-CM

## 2019-05-06 HISTORY — DX: Atherosclerotic heart disease of native coronary artery without angina pectoris: I25.10

## 2019-08-25 ENCOUNTER — Encounter: Payer: Self-pay | Admitting: Gastroenterology

## 2019-09-18 ENCOUNTER — Encounter: Payer: Self-pay | Admitting: Gastroenterology

## 2019-10-01 ENCOUNTER — Other Ambulatory Visit: Payer: Self-pay | Admitting: Obstetrics and Gynecology

## 2019-10-01 ENCOUNTER — Telehealth: Payer: Self-pay | Admitting: *Deleted

## 2019-10-01 DIAGNOSIS — E2839 Other primary ovarian failure: Secondary | ICD-10-CM

## 2019-10-01 DIAGNOSIS — Z79890 Hormone replacement therapy (postmenopausal): Secondary | ICD-10-CM

## 2019-10-01 NOTE — Telephone Encounter (Signed)
Proceed with colonoscopy I know her well.  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 I believe she had family history of colonic polyps RG

## 2019-10-01 NOTE — Telephone Encounter (Signed)
Dr. Lyndel Safe,  I am getting this pt's chart ready for her upcoming colonoscopy on 10-23-19.  Her 2016 procedure showed diverticulosis only, her prep was good, and as far as I can tell she does not have a fam hx of colon CA.  Would you like her to proceed at this time? Please advise.  Thanks, [employer]

## 2019-10-01 NOTE — Telephone Encounter (Signed)
noted 

## 2019-10-09 ENCOUNTER — Ambulatory Visit (AMBULATORY_SURGERY_CENTER): Payer: Self-pay

## 2019-10-09 ENCOUNTER — Other Ambulatory Visit: Payer: Self-pay

## 2019-10-09 VITALS — Temp 97.6°F | Ht 63.0 in | Wt 164.6 lb

## 2019-10-09 DIAGNOSIS — Z800 Family history of malignant neoplasm of digestive organs: Secondary | ICD-10-CM

## 2019-10-09 MED ORDER — CLENPIQ 10-3.5-12 MG-GM -GM/160ML PO SOLN: 1.0000 | Freq: Once | ORAL | 0 refills | Status: AC

## 2019-10-09 NOTE — Progress Notes (Signed)
No allergies to soy or egg Pt is not on blood thinners or diet pills Denies issues with sedation/intubation Denies atrial flutter/fib Denies constipation   Emmi instructions given to pt  Pt is aware of Covid safety and care partner requirements.   Pt is aware of time change of procedure on 4/1.  Updated prep letter given.

## 2019-10-16 DIAGNOSIS — R600 Localized edema: Secondary | ICD-10-CM

## 2019-10-16 HISTORY — DX: Localized edema: R60.0

## 2019-10-23 ENCOUNTER — Ambulatory Visit (AMBULATORY_SURGERY_CENTER): Payer: Medicare PPO | Admitting: Gastroenterology

## 2019-10-23 ENCOUNTER — Encounter: Payer: Self-pay | Admitting: Gastroenterology

## 2019-10-23 ENCOUNTER — Other Ambulatory Visit: Payer: Self-pay

## 2019-10-23 VITALS — BP 139/63 | HR 71 | Temp 96.9°F | Resp 13 | Ht 63.0 in | Wt 164.0 lb

## 2019-10-23 DIAGNOSIS — D125 Benign neoplasm of sigmoid colon: Secondary | ICD-10-CM

## 2019-10-23 DIAGNOSIS — D122 Benign neoplasm of ascending colon: Secondary | ICD-10-CM | POA: Diagnosis not present

## 2019-10-23 DIAGNOSIS — Z1211 Encounter for screening for malignant neoplasm of colon: Secondary | ICD-10-CM | POA: Diagnosis not present

## 2019-10-23 DIAGNOSIS — D128 Benign neoplasm of rectum: Secondary | ICD-10-CM

## 2019-10-23 DIAGNOSIS — D120 Benign neoplasm of cecum: Secondary | ICD-10-CM | POA: Diagnosis not present

## 2019-10-23 DIAGNOSIS — D127 Benign neoplasm of rectosigmoid junction: Secondary | ICD-10-CM | POA: Diagnosis not present

## 2019-10-23 DIAGNOSIS — Z8371 Family history of colonic polyps: Secondary | ICD-10-CM

## 2019-10-23 DIAGNOSIS — K621 Rectal polyp: Secondary | ICD-10-CM | POA: Diagnosis not present

## 2019-10-23 MED ORDER — SODIUM CHLORIDE 0.9 % IV SOLN: 500.0000 mL | Freq: Once | INTRAVENOUS | Status: DC

## 2019-10-23 NOTE — Op Note (Signed)
[place] Patient Name: Christy Mccoy Procedure Date: 10/23/2019 10:53 AM MRN: [id number] Endoscopist: Jackquline Denmark , MD Age: 64 Referring MD:  Date of Birth: [birth year] Gender: Female Account #: [id number] Procedure:                Colonoscopy Indications:              Screening for colorectal malignant neoplasm. FH                            polyps. Medicines:                Monitored Anesthesia Care Procedure:                Pre-Anesthesia Assessment:                           - Prior to the procedure, a History and Physical                            was performed, and patient medications and                            allergies were reviewed. The patient's tolerance of                            previous anesthesia was also reviewed. The risks                            and benefits of the procedure and the sedation                            options and risks were discussed with the patient.                            All questions were answered, and informed consent                            was obtained. Prior Anticoagulants: The patient has                            taken no previous anticoagulant or antiplatelet                            agents. ASA Grade Assessment: II - A patient with                            mild systemic disease. After reviewing the risks                            and benefits, the patient was deemed in                            satisfactory condition to undergo the procedure.  After obtaining informed consent, the colonoscope                            was passed under direct vision. Throughout the                            procedure, the patient's blood pressure, pulse, and                            oxygen saturations were monitored continuously. The                            Colonoscope was introduced through the anus and                            advanced to the 2 cm into the ileum. The           colonoscopy was performed without difficulty. The                            patient tolerated the procedure well. The quality                            of the bowel preparation was good. The terminal                            ileum, ileocecal valve, appendiceal orifice, and                            rectum were photographed. Scope In: 10:58:04 AM Scope Out: 11:18:14 AM Scope Withdrawal Time: 0 hours 15 minutes 28 seconds  Total Procedure Duration: 0 hours 20 minutes 10 seconds  Findings:                 Four sessile polyps were found in the rectum,                            sigmoid colon, ascending colon and cecum. The                            polyps were 4 to 6 mm in size. These polyps were                            removed with a cold snare. Resection and retrieval                            were complete.                           Multiple medium-mouthed diverticula were found in                            the sigmoid colon, few in descending colon,  transverse colon and ascending colon. Some                            diverticula had stool impacted especially in the                            sigmoid colon. No endoscopic evidence of                            diverticulitis.                           Non-bleeding internal hemorrhoids were found during                            retroflexion. The hemorrhoids were small.                           The terminal ileum appeared normal.                           The exam was otherwise without abnormality on                            direct and retroflexion views. Complications:            No immediate complications. Estimated Blood Loss:     Estimated blood loss: none. Impression:               -Colonic polyps s/p polypectomy.                           -Pancolonic diverticulosis predominantly in the                            sigmoid colon.                           -Non-bleeding internal  hemorrhoids.                           -Otherwise normal colonoscopy to TI. Recommendation:           - Patient has a contact number available for                            emergencies. The signs and symptoms of potential                            delayed complications were discussed with the                            patient. Return to normal activities tomorrow.                            Written discharge instructions were provided to the  patient.                           - High fiber diet.                           - Continue present medications.                           - Await pathology results.                           - Repeat colonoscopy for surveillance based on                            pathology results.                           - The findings and recommendations were discussed                            with the patient's family [facility]).                           - If any anorectal problems, use Preparation H and                            get in touch with [place].                           - Return to GI clinic PRN. Jackquline Denmark, MD 10/23/2019 11:27:59 AM This report has been signed electronically.

## 2019-10-23 NOTE — Patient Instructions (Signed)
INFORMATION ON POLYPS, DIVERTICULOSIS AND HEMORRHOIDS GIVEN TO YOU TODAY.  AWAIT PATHOLOGY RESULTS.  EAT A HIGH FIBER DIET.  USE PREPERATION H IF YOU HAVE ANY ANORECTAL PROBLEMS.  YOU HAD AN ENDOSCOPIC PROCEDURE TODAY AT [facility]:   Refer to the procedure report that was given to you for any specific questions about what was found during the examination.  If the procedure report does not answer your questions, please call your gastroenterologist to clarify.  If you requested that your care partner not be given the details of your procedure findings, then the procedure report has been included in a sealed envelope for you to review at your convenience later.  YOU SHOULD EXPECT: Some feelings of bloating in the abdomen. Passage of more gas than usual.  Walking can help get rid of the air that was put into your GI tract during the procedure and reduce the bloating. If you had a lower endoscopy (such as a colonoscopy or flexible sigmoidoscopy) you may notice spotting of blood in your stool or on the toilet paper. If you underwent a bowel prep for your procedure, you may not have a normal bowel movement for a few days.  Please Note:  You might notice some irritation and congestion in your nose or some drainage.  This is from the oxygen used during your procedure.  There is no need for concern and it should clear up in a day or so.  SYMPTOMS TO REPORT IMMEDIATELY:   Following lower endoscopy (colonoscopy or flexible sigmoidoscopy):  Excessive amounts of blood in the stool  Significant tenderness or worsening of abdominal pains  Swelling of the abdomen that is new, acute  Fever of 100F or higher   For urgent or emergent issues, a gastroenterologist can be reached at any hour by calling [phone]. Do not use MyChart messaging for urgent concerns.    DIET:  We do recommend a small meal at first, but then you may proceed to your regular diet.  Drink plenty of fluids but  you should avoid alcoholic beverages for 24 hours.  ACTIVITY:  You should plan to take it easy for the rest of today and you should NOT DRIVE or use heavy machinery until tomorrow (because of the sedation medicines used during the test).    FOLLOW UP: Our staff will call the number listed on your records 48-72 hours following your procedure to check on you and address any questions or concerns that you may have regarding the information given to you following your procedure. If we do not reach you, we will leave a message.  We will attempt to reach you two times.  During this call, we will ask if you have developed any symptoms of COVID 19. If you develop any symptoms (ie: fever, flu-like symptoms, shortness of breath, cough etc.) before then, please call [phone].  If you test positive for Covid 19 in the 2 weeks post procedure, please call and report this information to [place].    If any biopsies were taken you will be contacted by phone or by letter within the next 1-3 weeks.  Please call us at [phone] if you have not heard about the biopsies in 3 weeks.    SIGNATURES/CONFIDENTIALITY: You and/or your care partner have signed paperwork which will be entered into your electronic medical record.  These signatures attest to the fact that that the information above on your After Visit Summary has been reviewed and is understood.  Full responsibility  of the confidentiality of this discharge information lies with you and/or your care-partner.

## 2019-10-23 NOTE — Progress Notes (Signed)
Called to room to assist during endoscopic procedure.  Patient ID and intended procedure confirmed with present staff. Received instructions for my participation in the procedure from the performing physician.  

## 2019-10-23 NOTE — Progress Notes (Signed)
Pt's states no medical or surgical changes since previsit or office visit. 

## 2019-10-23 NOTE — Progress Notes (Signed)
A and O x3. Report to RN. Tolerated MAC anesthesia well.

## 2019-10-28 ENCOUNTER — Telehealth: Payer: Self-pay

## 2019-10-28 NOTE — Telephone Encounter (Signed)
  Follow up Call-  Call back number 10/23/2019  Post procedure Call Back phone  # [phone]  Permission to leave phone message Yes  Some recent data might be hidden     Patient questions:  Do you have a fever, pain , or abdominal swelling? No. Pain Score  0 *  Have you tolerated food without any problems? Yes.    Have you been able to return to your normal activities? Yes.    Do you have any questions about your discharge instructions: Diet   No. Medications  No. Follow up visit  No.  Do you have questions or concerns about your Care? No.  Actions: * If pain score is 4 or above: No action needed, pain <4.  1. Have you developed a fever since your procedure? No  2.   Have you had an respiratory symptoms (SOB or cough) since your procedure? No  3.   Have you tested positive for COVID 19 since your procedure No  4.   Have you had any family members/close contacts diagnosed with the COVID 19 since your procedure?  No   If yes to any of these questions please route to Joylene John, RN and Erenest Rasher, RN

## 2019-11-09 ENCOUNTER — Encounter: Payer: Self-pay | Admitting: Gastroenterology

## 2019-12-16 ENCOUNTER — Other Ambulatory Visit: Payer: BC Managed Care – PPO

## 2019-12-26 ENCOUNTER — Other Ambulatory Visit: Payer: Self-pay | Admitting: Cardiology

## 2020-01-22 ENCOUNTER — Other Ambulatory Visit: Payer: Self-pay

## 2020-01-22 ENCOUNTER — Ambulatory Visit
Admission: RE | Admit: 2020-01-22 | Discharge: 2020-01-22 | Disposition: A | Discharge status: Home / Self Care | Payer: Medicare PPO | Source: Ambulatory Visit | Attending: Obstetrics and Gynecology | Admitting: Obstetrics and Gynecology

## 2020-01-22 DIAGNOSIS — E2839 Other primary ovarian failure: Secondary | ICD-10-CM

## 2020-01-22 DIAGNOSIS — Z79890 Hormone replacement therapy (postmenopausal): Secondary | ICD-10-CM

## 2020-09-22 ENCOUNTER — Other Ambulatory Visit: Payer: Self-pay | Admitting: Cardiology

## 2020-10-22 ENCOUNTER — Other Ambulatory Visit: Payer: Self-pay | Admitting: Cardiology

## 2020-10-22 NOTE — Telephone Encounter (Signed)
Refill sent to pharmacy.   

## 2020-11-24 ENCOUNTER — Other Ambulatory Visit: Payer: Self-pay | Admitting: Cardiology

## 2020-12-23 ENCOUNTER — Other Ambulatory Visit: Payer: Self-pay | Admitting: Cardiology

## 2021-01-25 ENCOUNTER — Other Ambulatory Visit: Payer: Self-pay | Admitting: Cardiology

## 2021-01-25 NOTE — Telephone Encounter (Signed)
Pharmacy notified, patient will need to follow up with Dr. Bettina Gavia for additional refills.

## 2021-02-01 ENCOUNTER — Other Ambulatory Visit: Payer: Self-pay

## 2021-02-01 DIAGNOSIS — H1010 Acute atopic conjunctivitis, unspecified eye: Secondary | ICD-10-CM | POA: Insufficient documentation

## 2021-02-01 DIAGNOSIS — T7840XA Allergy, unspecified, initial encounter: Secondary | ICD-10-CM | POA: Insufficient documentation

## 2021-02-01 DIAGNOSIS — J309 Allergic rhinitis, unspecified: Secondary | ICD-10-CM | POA: Insufficient documentation

## 2021-02-01 DIAGNOSIS — I10 Essential (primary) hypertension: Secondary | ICD-10-CM | POA: Insufficient documentation

## 2021-02-01 NOTE — Progress Notes (Signed)
Cardiology Office Note:    Date:  02/02/2021   ID:  Christy Mccoy, Christy Mccoy 26-Aug-1955, MRN [id number]  PCP:  Algis Greenhouse, MD  Cardiologist:  Shirlee More, MD    Referring MD: Algis Greenhouse, MD    ASSESSMENT:    1. Hypertensive heart disease without heart failure   2. APC (atrial premature contractions)   3. Premature ventricular contractions   4. Atherosclerosis of native coronary artery of native heart without angina pectoris   5. Acquired hypothyroidism   6. Screening cholesterol level    PLAN:    In order of problems listed above:  Overall doing well BP is at target continue current treatment I encouraged her to check and record her blood pressures at home.  She takes MRA and recheck renal function potassium Improved atrial and ventricular arrhythmias she takes a calcium channel blocker. Stable coronary atherosclerosis no angina. With tremor check her thyroid levels Await lipid profile, unless ideal lipids she would benefit from statin mild coronary atherosclerosis   Next appointment: 1 year   Medication Adjustments/Labs and Tests Ordered: Current medicines are reviewed at length with the patient today.  Concerns regarding medicines are outlined above.  Orders Placed This Encounter  Procedures   Comprehensive metabolic panel   Lipid panel   TSH   T4   T3   EKG 12-Lead   No orders of the defined types were placed in this encounter.   Chief Complaint  Patient presents with   Follow-up    She has had both atrial and ventricular arrhythmia uncontrolled high pretension nonanginal chest pain and very mild coronary atherosclerosis" arteriography.    History of Present Illness:    Christy Mccoy is a 65 y.o. female with a hx of asthma symptomatic PVCs and APCs hypertension and hypertensive heart disease and mild LV dysfunction EF 45 to 50% mild LVH and mild left atrial enlargement.  She subsequent underwent coronary angiography which showed minimal  atherosclerosis with a 10% diagonal stenosis otherwise normal coronary arteriography EF estimated 55% and normal filling pressure.  Last seen 11/05/2018. Compliance with diet, lifestyle and medications: Yes  Christy Mccoy remains very active and plays tennis 3 to 4 days/week and has had no recurrent chest discomfort shortness of breath wheezing potation or syncope. She is not checking home blood pressure BP by me repeated 132/80 She is overdue for labs we will check CMP lipid profile and she is having a tremor and I will recheck her thyroid levels on replacement therapy. Is not taking a statin. Past Medical History:  Diagnosis Date   Acute non-recurrent sinusitis 04/07/2016   Formatting of this note might be different from the original. 2017, 2020   Allergic rhinoconjunctivitis    Allergy    per pt   APC (atrial premature contractions) 11/05/2018   Asthma    Asthmatic bronchitis with acute exacerbation 04/15/2015   Bilateral lower extremity edema 10/16/2019   Formatting of this note might be different from the original. 2021   Chest pain    Educated about COVID-19 virus infection 11/05/2018   Gastroesophageal reflux disease 04/03/2016   Hypertension    Hypertensive heart disease 07/04/2016   Hypothyroidism    Palpitations 09/09/2018   Premature ventricular contractions 07/04/2016   2020    Past Surgical History:  Procedure Laterality Date   ABDOMINAL HYSTERECTOMY  Age 48   CHOLECYSTECTOMY     COLONOSCOPY  2016   CYST REMOVAL TRUNK     FINGER SURGERY Left  Left ring finger   HERNIA REPAIR     laparoscopic cholecystectomy     LEFT HEART CATH AND CORONARY ANGIOGRAPHY N/A 10/01/2018   Procedure: LEFT HEART CATH AND CORONARY ANGIOGRAPHY;  Surgeon: Troy Sine, MD;  Location: [facility];  Service: Cardiovascular;  Laterality: N/A;   OVARIAN CYST REMOVAL  Age 48    Current Medications: Current Meds  Medication Sig   Albuterol Sulfate (PROAIR RESPICLICK) 196 (90 Base) MCG/ACT  AEPB Inhale 2 puffs into the lungs every 4 (four) hours as needed for wheezing or shortness of breath (wheezing/shortness of breath/cough).   BIOTIN PO Take 2 tablets by mouth daily. Gummies   cetirizine (ZYRTEC) 10 MG tablet Take 10 mg by mouth every evening.   ELDERBERRY PO Take 1 capsule by mouth daily.   estradiol (ESTRACE) 0.5 MG tablet Take 0.5 mg by mouth at bedtime.    famotidine (PEPCID) 40 MG tablet Take 1 tablet (40 mg total) by mouth nightly. reflux   fluticasone (FLONASE) 50 MCG/ACT nasal spray Place 1 spray into the nose daily as needed for allergies or congestion (allergies/congestion.).   fluticasone (FLOVENT HFA) 220 MCG/ACT inhaler Inhale 1-2 puffs into the lungs as needed for wheezing or shortness of breath.   levothyroxine (SYNTHROID, LEVOTHROID) 88 MCG tablet Take 88 mcg by mouth daily before breakfast.    montelukast (SINGULAIR) 10 MG tablet Take 10 mg by mouth at bedtime.    Multiple Vitamins-Minerals (ADULT GUMMY PO) Take 2 tablets by mouth daily with lunch.   omeprazole (PRILOSEC) 40 MG capsule TAKE ONE CAPSULE EACH MORNING BEFORE BREAKFAST   spironolactone (ALDACTONE) 25 MG tablet TAKE ONE TABLET BY MOUTH EVERY DAY WITH LUNCH   verapamil (VERELAN PM) 240 MG 24 hr capsule Take 240 mg by mouth daily.     Allergies:   Lisinopril and Codeine   Social History   Socioeconomic History   Marital status: Married    Spouse name: Not on file   Number of children: Not on file   Years of education: Not on file   Highest education level: Not on file  Occupational History   Not on file  Tobacco Use   Smoking status: Never   Smokeless tobacco: Never  Vaping Use   Vaping Use: Never used  Substance and Sexual Activity   Alcohol use: Yes    Comment: social   Drug use: Never   Sexual activity: Not on file  Other Topics Concern   Not on file  Social History Narrative   Not on file   Social Determinants of Health   Financial Resource Strain: Not on file  Food  Insecurity: Not on file  Transportation Needs: Not on file  Physical Activity: Not on file  Stress: Not on file  Social Connections: Not on file     Family History: The patient's family history includes Dementia in her father; Diabetes in her father; Heart attack in her paternal grandfather; Lymphoma in her mother; Stroke in her maternal grandmother and paternal grandmother. There is no history of Colon cancer, Colon polyps, Esophageal cancer, Rectal cancer, or Stomach cancer. ROS:   Please see the history of present illness.    All other systems reviewed and are negative.  EKGs/Labs/Other Studies Reviewed:    The following studies were reviewed today:  EKG:  EKG ordered today and personally reviewed.  The ekg ordered today demonstrates sinus rhythm normal no atrial or ventricular arrhythmia    Physical Exam:    VS:  BP 138/80   Pulse 89   Ht 5\' 3"  (1.6 m)   Wt 169 lb 9.6 oz (76.9 kg)   SpO2 97%   BMI 30.04 kg/m     Wt Readings from Last 3 Encounters:  02/02/21 169 lb 9.6 oz (76.9 kg)  10/23/19 164 lb (74.4 kg)  10/09/19 164 lb 9.6 oz (74.7 kg)     GEN:  Well nourished, well developed in no acute distress HEENT: Normal NECK: No JVD; No carotid bruits LYMPHATICS: No lymphadenopathy CARDIAC: RRR, no murmurs, rubs, gallops RESPIRATORY:  Clear to auscultation without rales, wheezing or rhonchi  ABDOMEN: Soft, non-tender, non-distended MUSCULOSKELETAL:  No edema; No deformity  SKIN: Warm and dry NEUROLOGIC:  Alert and oriented x 3 PSYCHIATRIC:  Normal affect    Signed, Shirlee More, MD  02/02/2021 4:56 PM    [facility]

## 2021-02-02 ENCOUNTER — Encounter: Payer: Self-pay | Admitting: Cardiology

## 2021-02-02 ENCOUNTER — Other Ambulatory Visit: Payer: Self-pay

## 2021-02-02 ENCOUNTER — Ambulatory Visit: Payer: Medicare PPO | Admitting: Cardiology

## 2021-02-02 VITALS — BP 138/80 | HR 89 | Ht 63.0 in | Wt 169.6 lb

## 2021-02-02 DIAGNOSIS — I491 Atrial premature depolarization: Secondary | ICD-10-CM

## 2021-02-02 DIAGNOSIS — I119 Hypertensive heart disease without heart failure: Secondary | ICD-10-CM | POA: Diagnosis not present

## 2021-02-02 DIAGNOSIS — I493 Ventricular premature depolarization: Secondary | ICD-10-CM | POA: Diagnosis not present

## 2021-02-02 DIAGNOSIS — E785 Hyperlipidemia, unspecified: Secondary | ICD-10-CM

## 2021-02-02 DIAGNOSIS — Z13220 Encounter for screening for lipoid disorders: Secondary | ICD-10-CM

## 2021-02-02 DIAGNOSIS — E039 Hypothyroidism, unspecified: Secondary | ICD-10-CM

## 2021-02-02 DIAGNOSIS — I2510 Atherosclerotic heart disease of native coronary artery without angina pectoris: Secondary | ICD-10-CM

## 2021-02-02 NOTE — Patient Instructions (Signed)
Medication Instructions:  No medication changes. *If you need a refill on your cardiac medications before your next appointment, please call your pharmacy*   Lab Work: Your physician recommends that you have labs done in the office today. Your test included  CMP, TSH, T3, T4 and lipids.  If you have labs (blood work) drawn today and your tests are completely normal, you will receive your results only by: [place] (if you have MyChart) OR A paper copy in the mail If you have any lab test that is abnormal or we need to change your treatment, we will call you to review the results.   Testing/Procedures: None ordered   Follow-Up: At [facility], you and your health needs are our priority.  As part of our continuing mission to provide you with exceptional heart care, we have created designated Provider Care Teams.  These Care Teams include your primary Cardiologist (physician) and Advanced Practice Providers (APPs -  Physician Assistants and Nurse Practitioners) who all work together to provide you with the care you need, when you need it.  We recommend signing up for the patient portal called "MyChart".  Sign up information is provided on this After Visit Summary.  MyChart is used to connect with patients for Virtual Visits (Telemedicine).  Patients are able to view lab/test results, encounter notes, upcoming appointments, etc.  Non-urgent messages can be sent to your provider as well.   To learn more about what you can do with MyChart, go to [email or web address].    Your next appointment:   12 month(s)  The format for your next appointment:   In Person  Provider:   Shirlee More, MD   Other Instructions NA

## 2021-02-03 LAB — COMPREHENSIVE METABOLIC PANEL
ALT: 27 IU/L (ref 0–32)
AST: 21 IU/L (ref 0–40)
Albumin/Globulin Ratio: 1.8 (ref 1.2–2.2)
Albumin: 4.3 g/dL (ref 3.8–4.8)
Alkaline Phosphatase: 87 IU/L (ref 44–121)
BUN/Creatinine Ratio: 18 (ref 12–28)
BUN: 16 mg/dL (ref 8–27)
Bilirubin Total: 0.3 mg/dL (ref 0.0–1.2)
CO2: 24 mmol/L (ref 20–29)
Calcium: 9.3 mg/dL (ref 8.7–10.3)
Chloride: 102 mmol/L (ref 96–106)
Creatinine, Ser: 0.88 mg/dL (ref 0.57–1.00)
Globulin, Total: 2.4 g/dL (ref 1.5–4.5)
Glucose: 94 mg/dL (ref 65–99)
Potassium: 4.5 mmol/L (ref 3.5–5.2)
Sodium: 138 mmol/L (ref 134–144)
Total Protein: 6.7 g/dL (ref 6.0–8.5)
eGFR: 72 mL/min/{1.73_m2} (ref >59)

## 2021-02-03 LAB — LIPID PANEL
Chol/HDL Ratio: 3.4 ratio (ref 0.0–4.4)
Cholesterol, Total: 193 mg/dL (ref 100–199)
HDL: 56 mg/dL (ref >39)
LDL Chol Calc (NIH): 113 mg/dL — ABNORMAL HIGH (ref 0–99)
Triglycerides: 137 mg/dL (ref 0–149)
VLDL Cholesterol Cal: 24 mg/dL (ref 5–40)

## 2021-02-03 LAB — T3: T3, Total: 116 ng/dL (ref 71–180)

## 2021-02-03 LAB — T4: T4, Total: 9.5 ug/dL (ref 4.5–12.0)

## 2021-02-03 LAB — TSH: TSH: 3.5 u[IU]/mL (ref 0.450–4.500)

## 2021-02-03 MED ORDER — PRAVASTATIN SODIUM 20 MG PO TABS: 20.0000 mg | ORAL_TABLET | Freq: Every evening | ORAL | 3 refills | Status: DC

## 2021-02-03 NOTE — Addendum Note (Signed)
Addended by: Truddie Hidden on: 02/03/2021 04:54 PM   Modules accepted: Orders

## 2021-02-22 ENCOUNTER — Other Ambulatory Visit: Payer: Self-pay | Admitting: Cardiology

## 2021-02-23 ENCOUNTER — Encounter: Payer: Self-pay | Admitting: Allergy and Immunology

## 2021-02-23 ENCOUNTER — Ambulatory Visit: Payer: Medicare PPO | Admitting: Allergy and Immunology

## 2021-02-23 ENCOUNTER — Other Ambulatory Visit: Payer: Self-pay

## 2021-02-23 VITALS — BP 140/86 | HR 101 | Resp 16 | Ht 63.2 in | Wt 168.2 lb

## 2021-02-23 DIAGNOSIS — J3089 Other allergic rhinitis: Secondary | ICD-10-CM | POA: Diagnosis not present

## 2021-02-23 DIAGNOSIS — K219 Gastro-esophageal reflux disease without esophagitis: Secondary | ICD-10-CM | POA: Diagnosis not present

## 2021-02-23 DIAGNOSIS — J4530 Mild persistent asthma, uncomplicated: Secondary | ICD-10-CM

## 2021-02-23 NOTE — Progress Notes (Signed)
[place] [facility] [place] [facility] [place] - [place] [facility] [place]   Dear Dr. Garlon Hatchet,  Thank you for referring Christy Mccoy to the [facility] on 02/23/2021.   Below is a summation of this patient's evaluation and recommendations.  Thank you for your referral. I will keep you informed about this patient's response to treatment.   If you have any questions please do not hesitate to contact me.   Sincerely,  Jiles Prows, MD Allergy / Immunology [place]   ______________________________________________________________________    NEW PATIENT NOTE  Referring Provider: Algis Greenhouse, MD Primary Provider: Algis Greenhouse, MD Date of office visit: 02/23/2021    Subjective:   Chief Complaint:  Christy Mccoy (DOB: 07/22/1956) is a 65 y.o. female who presents to the clinic on 02/23/2021 with a chief complaint of Asthma .     HPI: Christy Mccoy returns to this clinic in reevaluation of asthma and allergic rhinitis and history of LPR.  I have not seen her in this clinic since 18 November 2015.  This past year Christy Mccoy has had a fair amount of problems with cough.  Her cough is associated with some throat clearing and some junk in her throat and intermittent raspy voice.  She does not really have any chest tightness or shortness of breath and she plays tennis 4-5 times per week for about 2 hours per session with no difficulty at all.  She underwent evaluation with cardiology in 2020 for an issue with palpitations and apparently has good heart function without any significant coronary artery disease.  She underwent evaluation with ENT this spring with the performance of a rhinoscopy which apparently did not identify any significant problems with her throat.  She consistently uses Flovent and has a very rare requirement for short acting bronchodilator.  She takes montelukast as well but she is not really sure  that montelukast gives her any benefit at all.  She does have an issue with nasal congestion and sneezing for which she will occasionally use some Flonase.  She has reflux disease that is treated with a combination of omeprazole and famotidine which she believes works quite well for her classic reflux symptoms.  She does drink 3 Cokes zeros per day, tea a few times per week, chocolate once a week, and wine at dinner on most nights.  She has had 3 COVID vaccines and contracted a COVID-vaccine and May 2022 manifested as a sinus infection and may have had a very early COVID infection in 2020.   Past Medical History:  Diagnosis Date   Acute non-recurrent sinusitis 04/07/2016   Formatting of this note might be different from the original. 2017, 2020   Allergic rhinoconjunctivitis    Allergy    per pt   APC (atrial premature contractions) 11/05/2018   Asthma    Asthmatic bronchitis with acute exacerbation 04/15/2015   Bilateral lower extremity edema 10/16/2019   Formatting of this note might be different from the original. 2021   Chest pain    Educated about COVID-19 virus infection 11/05/2018   Gastroesophageal reflux disease 04/03/2016   Hypertension    Hypertensive heart disease 07/04/2016   Hypothyroidism    Palpitations 09/09/2018   Premature ventricular contractions 07/04/2016   2020    Past Surgical History:  Procedure Laterality Date   ABDOMINAL HYSTERECTOMY  Age 26   CHOLECYSTECTOMY     COLONOSCOPY  2016  CYST REMOVAL TRUNK     FINGER SURGERY Left    Left ring finger   HERNIA REPAIR     laparoscopic cholecystectomy     LEFT HEART CATH AND CORONARY ANGIOGRAPHY N/A 10/01/2018   Procedure: LEFT HEART CATH AND CORONARY ANGIOGRAPHY;  Surgeon: Troy Sine, MD;  Location: [facility];  Service: Cardiovascular;  Laterality: N/A;   OVARIAN CYST REMOVAL  Age 31    Allergies as of 02/23/2021       Reactions   Lisinopril Other (See Comments), Cough   Codeine Anxiety, Other  (See Comments)   Heart race        Medication List    ADULT GUMMY PO Take 2 tablets by mouth daily with lunch.   BIOTIN PO Take 2 tablets by mouth daily. Gummies   cetirizine 10 MG tablet Commonly known as: ZYRTEC Take 10 mg by mouth every evening.   ELDERBERRY PO Take 1 capsule by mouth daily.   estradiol 0.5 MG tablet Commonly known as: ESTRACE Take 0.5 mg by mouth at bedtime.   famotidine 40 MG tablet Commonly known as: PEPCID Take 1 tablet (40 mg total) by mouth nightly. reflux   fluticasone 220 MCG/ACT inhaler Commonly known as: FLOVENT HFA Inhale 1-2 puffs into the lungs as needed for wheezing or shortness of breath.   fluticasone 50 MCG/ACT nasal spray Commonly known as: FLONASE Place 1 spray into the nose daily as needed for allergies or congestion (allergies/congestion.).   levothyroxine 88 MCG tablet Commonly known as: SYNTHROID Take 88 mcg by mouth daily before breakfast.   montelukast 10 MG tablet Commonly known as: SINGULAIR Take 10 mg by mouth at bedtime.   omeprazole 40 MG capsule Commonly known as: PRILOSEC TAKE ONE CAPSULE EACH MORNING BEFORE BREAKFAST   pravastatin 20 MG tablet Commonly known as: PRAVACHOL Take 1 tablet (20 mg total) by mouth every evening.   ProAir HFA 108 (90 Base) MCG/ACT inhaler Generic drug: albuterol Inhale 2 puffs into the lungs every 4 (four) hours as needed for wheezing or shortness of breath.   spironolactone 25 MG tablet Commonly known as: ALDACTONE TAKE ONE TABLET BY MOUTH EVERY DAY WITH LUNCH   verapamil 240 MG 24 hr capsule Commonly known as: VERELAN PM Take 240 mg by mouth daily.        Review of systems negative except as noted in HPI / PMHx or noted below:  Review of Systems  Constitutional: Negative.   HENT: Negative.    Eyes: Negative.   Respiratory: Negative.    Cardiovascular: Negative.   Gastrointestinal: Negative.   Genitourinary: Negative.   Musculoskeletal: Negative.   Skin:  Negative.   Neurological: Negative.   Endo/Heme/Allergies: Negative.   Psychiatric/Behavioral: Negative.     Family History  Problem Relation Age of Onset   Diabetes Father    Dementia Father    Lymphoma Mother    Stroke Maternal Grandmother    Stroke Paternal Grandmother    Heart attack Paternal Grandfather    Colon cancer Neg Hx    Colon polyps Neg Hx    Esophageal cancer Neg Hx    Rectal cancer Neg Hx    Stomach cancer Neg Hx     Social History   Socioeconomic History   Marital status: Married    Spouse name: Not on file   Number of children: Not on file   Years of education: Not on file   Highest education level: Not on file  Occupational History  Not on file  Tobacco Use   Smoking status: Never   Smokeless tobacco: Never  Vaping Use   Vaping Use: Never used  Substance and Sexual Activity   Alcohol use: Yes    Comment: social   Drug use: Never   Sexual activity: Not on file  Other Topics Concern   Not on file  Social History Narrative   Not on file   Environmental and Social history  Lives in a house with a dry environment, 2 dogs located inside the household, carpet in the bedroom, no plastic on the bed, no plastic on the pillow, no smoking ongoing with inside the household.  Objective:   Vitals:   02/23/21 1442  BP: 140/86  Pulse: (!) 101  Resp: 16  SpO2: 98%   Height: 5' 3.2" (160.5 cm) Weight: 168 lb 3.2 oz (76.3 kg)  Physical Exam Constitutional:      Appearance: She is not diaphoretic.     Comments: Slightly raspy voice, throat clearing  HENT:     Head: Normocephalic.     Right Ear: Tympanic membrane, ear canal and external ear normal.     Left Ear: Tympanic membrane, ear canal and external ear normal.     Nose: Nose normal. No mucosal edema or rhinorrhea.     Mouth/Throat:     Pharynx: Uvula midline. No oropharyngeal exudate.  Eyes:     Conjunctiva/sclera: Conjunctivae normal.  Neck:     Thyroid: No thyromegaly.     Trachea:  Trachea normal. No tracheal tenderness or tracheal deviation.  Cardiovascular:     Rate and Rhythm: Normal rate and regular rhythm.     Heart sounds: Normal heart sounds, S1 normal and S2 normal. No murmur heard. Pulmonary:     Effort: No respiratory distress.     Breath sounds: Normal breath sounds. No stridor. No wheezing or rales.  Lymphadenopathy:     Head:     Right side of head: No tonsillar adenopathy.     Left side of head: No tonsillar adenopathy.     Cervical: No cervical adenopathy.  Skin:    Findings: No erythema or rash.     Nails: There is no clubbing.  Neurological:     Mental Status: She is alert.    Diagnostics: Allergy skin tests were not performed secondary to the recent use of an antihistamine  Spirometry was performed and demonstrated an FEV1 of 2.04 @ 91 % of predicted. FEV1/FVC = 0.80.  Results of a chest x-ray obtained 05 September 2018 identifies the following:  Lungs are clear. Heart size and pulmonary vascularity are normal. No  adenopathy. There are surgical clips in the right upper quadrant. No  evident bone lesions.   Assessment and Plan:    1. Asthma, well controlled, mild persistent   2. Perennial allergic rhinitis   3. LPRD (laryngopharyngeal reflux disease)     1.  Allergen avoidance measures? - check Area 2 Aeroallergen profile, CBC w/d  2.  Treat and prevent inflammation:  A. Flovent 220 - 1 inhalation 1-2 times per day (empty lungs) B. Flonase - 1-2 sprays each nostril 3-7 times per week  3. Does montelukast help??? Can try to discontinue  4. Treat and prevent reflux / LPR:  A. Omeprazole 40 mg - 1 tablet in AM B. Famotidine 40 mg - 1 tablet in PM C. Minimize caffeine, chocolate, alcohol  5. If needed:  A. Albuterol HFA - 2 inhalations every 4-6 hours B. antihistamine  6. "  Action Plan" for flare up:  A. Increase Flovent to 3 inhalations 3 times per day B. Albuterol HFA if needed    7. Immunotherapy???  8. Return to  clinic in 6 months or earlier if problem  9. Obtain fall flu vaccine  Christy Mccoy has a history of consistent with 2 major insults to her respiratory tract including both an atopic insult and a reflux induced insult.  We will work through her atopic disease by checking an aero allergen hypersensitivity profile and she can continue to use anti-inflammatory agents for both her upper and lower airway in a preventative manner.  And, will work through her reflux induced respiratory disease by having her focus on caffeine and chocolate and alcohol consumption while she continues on a proton pump inhibitor and H2 receptor blocker.  She would be a candidate for immunotherapy to address her atopic disease.  I will contact her with the results of her blood test once they are available for review.   Jiles Prows, MD Allergy / Immunology [facility]

## 2021-02-23 NOTE — Patient Instructions (Addendum)
  1.  Allergen avoidance measures? - check Area 2 Aeroallergen profile, CBC w/d  2.  Treat and prevent inflammation:  A. Flovent 220 - 1 inhalation 1-2 times per day (empty lungs) B. Flonase - 1-2 sprays each nostril 3-7 times per week  3. Does montelukast help??? Can try to discontinue  4. Treat and prevent reflux / LPR:  A. Omeprazole 40 mg - 1 tablet in AM B. Famotidine 40 mg - 1 tablet in PM C. Minimize caffeine, chocolate, alcohol  5. If needed:  A. Albuterol HFA - 2 inhalations every 4-6 hours B. antihistamine  6. "Action Plan" for flare up:  A. Increase Flovent to 3 inhalations 3 times per day B. Albuterol HFA if needed    7. Immunotherapy???  8. Return to clinic in 6 months or earlier if problem  9. Obtain fall flu vaccine

## 2021-02-24 ENCOUNTER — Encounter: Payer: Self-pay | Admitting: Allergy and Immunology

## 2021-03-02 LAB — ALLERGENS W/TOTAL IGE AREA 2

## 2021-03-02 LAB — CBC WITH DIFFERENTIAL/PLATELET
Basophils Absolute: 0.1 10*3/uL (ref 0.0–0.2)
Basos: 1 %
EOS (ABSOLUTE): 0.3 10*3/uL (ref 0.0–0.4)
Eos: 4 %
Hematocrit: 44.2 % (ref 34.0–46.6)
Hemoglobin: 14.7 g/dL (ref 11.1–15.9)
Immature Grans (Abs): 0 10*3/uL (ref 0.0–0.1)
Immature Granulocytes: 0 %
Lymphocytes Absolute: 2.1 10*3/uL (ref 0.7–3.1)
Lymphs: 29 %
MCH: 29.7 pg (ref 26.6–33.0)
MCHC: 33.3 g/dL (ref 31.5–35.7)
MCV: 89 fL (ref 79–97)
Monocytes Absolute: 0.8 10*3/uL (ref 0.1–0.9)
Monocytes: 11 %
Neutrophils Absolute: 4 10*3/uL (ref 1.4–7.0)
Neutrophils: 55 %
Platelets: 302 10*3/uL (ref 150–450)
RBC: 4.95 x10E6/uL (ref 3.77–5.28)
RDW: 12.4 % (ref 11.7–15.4)
WBC: 7.2 10*3/uL (ref 3.4–10.8)

## 2021-03-23 ENCOUNTER — Other Ambulatory Visit: Payer: Self-pay | Admitting: Cardiology

## 2021-04-25 ENCOUNTER — Other Ambulatory Visit: Payer: Self-pay | Admitting: Cardiology

## 2021-08-29 ENCOUNTER — Other Ambulatory Visit: Payer: Self-pay

## 2021-08-29 ENCOUNTER — Ambulatory Visit: Payer: Medicare PPO | Admitting: Allergy and Immunology

## 2021-08-29 ENCOUNTER — Encounter: Payer: Self-pay | Admitting: Allergy and Immunology

## 2021-08-29 VITALS — BP 130/82 | HR 96 | Resp 16

## 2021-08-29 DIAGNOSIS — K219 Gastro-esophageal reflux disease without esophagitis: Secondary | ICD-10-CM | POA: Diagnosis not present

## 2021-08-29 DIAGNOSIS — J3089 Other allergic rhinitis: Secondary | ICD-10-CM | POA: Diagnosis not present

## 2021-08-29 DIAGNOSIS — J4530 Mild persistent asthma, uncomplicated: Secondary | ICD-10-CM | POA: Diagnosis not present

## 2021-08-29 MED ORDER — ALBUTEROL SULFATE HFA 108 (90 BASE) MCG/ACT IN AERS: INHALATION_SPRAY | RESPIRATORY_TRACT | 1 refills | Status: DC

## 2021-08-29 NOTE — Patient Instructions (Addendum)
°  1.  Treat and prevent inflammation:  A. Flovent 220 - 1 inhalation 1-2 times per day  B. Flonase - 1-2 sprays each nostril 3-7 times per week  2. Treat and prevent reflux / LPR:  A. Omeprazole 40 mg - 1 tablet in AM B. Famotidine 40 mg - 1 tablet in PM C. Minimize caffeine, chocolate, alcohol  3. If needed:  A. Albuterol HFA - 2 inhalations every 4-6 hours B. antihistamine  4. "Action Plan" for flare up:  A. Increase Flovent to 3 inhalations 3 times per day B. Albuterol HFA if needed    5. Return to clinic in 6 months or earlier if problem

## 2021-08-29 NOTE — Progress Notes (Signed)
Christy Mccoy - [place] [facility] [place]   Follow-up Note  Referring Provider: Algis Greenhouse, MD Primary Provider: Algis Greenhouse, MD Date of Office Visit: 08/29/2021  Subjective:   Christy Mccoy (DOB: 1955-12-12) is a 66 y.o. female who returns to the [facility] on 08/29/2021 in re-evaluation of the following:  HPI: Christy Mccoy presents to this clinic in reevaluation of asthma, allergic rhinitis, and LPR.  Her last visit to this clinic was her initial evaluation of 23 February 2021.  Overall she is doing very well since her last visit regarding her asthma.  She has not required a systemic steroid to treat an exacerbation.  She rarely uses a short acting bronchodilator.  She plays tennis multiple times per week in a competitive manner and performs quite well in the sport.  She continues on Flovent at a dose of 220 mcg twice a day.  She had very little problems with her nose.  She has not required an antibiotic to treat an episode of sinusitis.  She intermittently uses a nasal steroid.  Her classic reflux symptoms are under good control.  She still occasionally has some issues with throat clearing and some junk in her throat and intermittent raspy voice but this appears to be better.  She still continues to drink caffeine and alcohol.  She has decreased her Chocolate consumption.  She continues on famotidine and omeprazole.  She has received 3 COVID vaccines and has received this year's flu vaccine.  Allergies as of 08/29/2021       Reactions   Lisinopril Other (See Comments), Cough   Codeine Anxiety, Other (See Comments)   Heart race        Medication List    ADULT GUMMY PO Take 2 tablets by mouth daily with lunch.   albuterol 108 (90 Base) MCG/ACT inhaler Commonly known as: VENTOLIN HFA Inhale 2 puffs into the lungs every 4 (four) hours as needed for wheezing or shortness of breath.   APPLE CIDER VINEGAR PO Take by mouth daily.   BIOTIN  PO Take 2 tablets by mouth daily. Gummies   cetirizine 10 MG tablet Commonly known as: ZYRTEC Take 10 mg by mouth every evening.   ELDERBERRY PO Take 1 capsule by mouth daily.   estradiol 0.5 MG tablet Commonly known as: ESTRACE Take 0.5 mg by mouth at bedtime.   famotidine 40 MG tablet Commonly known as: PEPCID Take 1 tablet (40 mg total) by mouth nightly. reflux   fluticasone 220 MCG/ACT inhaler Commonly known as: FLOVENT HFA Inhale 1-2 puffs into the lungs as needed for wheezing or shortness of breath.   fluticasone 50 MCG/ACT nasal spray Commonly known as: FLONASE Place 1 spray into the nose daily as needed for allergies or congestion (allergies/congestion.).   levothyroxine 88 MCG tablet Commonly known as: SYNTHROID Take 88 mcg by mouth daily before breakfast.   omeprazole 40 MG capsule Commonly known as: PRILOSEC TAKE ONE CAPSULE EACH MORNING BEFORE BREAKFAST   spironolactone 25 MG tablet Commonly known as: ALDACTONE TAKE ONE TABLET BY MOUTH EVERY DAY WITH LUNCH   verapamil 240 MG 24 hr capsule Commonly known as: VERELAN PM Take 240 mg by mouth daily.   VITAMIN C PO Take by mouth daily.   VITAMIN D PO Take by mouth.    Past Medical History:  Diagnosis Date   Acute non-recurrent sinusitis 04/07/2016   Formatting of this note might be different from the original. 2017, 2020  Allergic rhinoconjunctivitis    Allergy    per pt   APC (atrial premature contractions) 11/05/2018   Asthma    Asthmatic bronchitis with acute exacerbation 04/15/2015   Bilateral lower extremity edema 10/16/2019   Formatting of this note might be different from the original. 2021   Chest pain    Educated about COVID-19 virus infection 11/05/2018   Gastroesophageal reflux disease 04/03/2016   Hypertension    Hypertensive heart disease 07/04/2016   Hypothyroidism    Palpitations 09/09/2018   Premature ventricular contractions 07/04/2016   2020    Past Surgical History:   Procedure Laterality Date   ABDOMINAL HYSTERECTOMY  Age 43   CHOLECYSTECTOMY     COLONOSCOPY  2016   CYST REMOVAL TRUNK     FINGER SURGERY Left    Left ring finger   HERNIA REPAIR     laparoscopic cholecystectomy     LEFT HEART CATH AND CORONARY ANGIOGRAPHY N/A 10/01/2018   Procedure: LEFT HEART CATH AND CORONARY ANGIOGRAPHY;  Surgeon: Troy Sine, MD;  Location: [facility];  Service: Cardiovascular;  Laterality: N/A;   OVARIAN CYST REMOVAL  Age 48    Review of systems negative except as noted in HPI / PMHx or noted below:  Review of Systems  Constitutional: Negative.   HENT: Negative.    Eyes: Negative.   Respiratory: Negative.    Cardiovascular: Negative.   Gastrointestinal: Negative.   Genitourinary: Negative.   Musculoskeletal: Negative.   Skin: Negative.   Neurological: Negative.   Endo/Heme/Allergies: Negative.   Psychiatric/Behavioral: Negative.      Objective:   Vitals:   08/29/21 1530  BP: 130/82  Pulse: 96  Resp: 16  SpO2: 98%          Physical Exam Constitutional:      Appearance: She is not diaphoretic.  HENT:     Head: Normocephalic.     Right Ear: Tympanic membrane, ear canal and external ear normal.     Left Ear: Tympanic membrane, ear canal and external ear normal.     Nose: Nose normal. No mucosal edema or rhinorrhea.     Mouth/Throat:     Pharynx: Uvula midline. No oropharyngeal exudate.  Eyes:     Conjunctiva/sclera: Conjunctivae normal.  Neck:     Thyroid: No thyromegaly.     Trachea: Trachea normal. No tracheal tenderness or tracheal deviation.  Cardiovascular:     Rate and Rhythm: Normal rate and regular rhythm.     Heart sounds: Normal heart sounds, S1 normal and S2 normal. No murmur heard. Pulmonary:     Effort: No respiratory distress.     Breath sounds: Normal breath sounds. No stridor. No wheezing or rales.  Lymphadenopathy:     Head:     Right side of head: No tonsillar adenopathy.     Left side of head: No  tonsillar adenopathy.     Cervical: No cervical adenopathy.  Skin:    Findings: No erythema or rash.     Nails: There is no clubbing.  Neurological:     Mental Status: She is alert.    Diagnostics:    Spirometry was performed and demonstrated an FEV1 of 1.66 at 75 % of predicted.  Results of blood tests obtained 25 February 2021 identifies WBC 7.2, absolute eosinophil 300, absolute lymphocyte 2100, hemoglobin 14.7, platelet 302, IgE 9 KU/L, no IgE antibodies directed against aeroallergens on an area two aero allergen panel.  Assessment and Plan:   1. Asthma,  well controlled, mild persistent   2. Perennial allergic rhinitis   3. LPRD (laryngopharyngeal reflux disease)     1.  Treat and prevent inflammation:  A. Flovent 220 - 1 inhalation 1-2 times per day B. Flonase - 1-2 sprays each nostril 3-7 times per week  2. Treat and prevent reflux / LPR:  A. Omeprazole 40 mg - 1 tablet in AM B. Famotidine 40 mg - 1 tablet in PM C. Minimize caffeine, chocolate, alcohol  3. If needed:  A. Albuterol HFA - 2 inhalations every 4-6 hours B. antihistamine  4. "Action Plan" for flare up:  A. Increase Flovent to 3 inhalations 3 times per day B. Albuterol HFA if needed    5. Return to clinic in 6 months or earlier if problem   Christy Mccoy has a very good understanding of her disease state and how her medications work and appropriate dosing of her medications depending on her disease activity.  She will continue on a dose of Flovent and a dose of Flonase to address the inflammatory component of his respiratory tract issues and she will continue on omeprazole and famotidine to address her LPR.  Assuming she does well with this plan I will see her back in this clinic in 6 months or earlier if there is a problem.  Christy Katz, MD Allergy / Immunology [place]

## 2021-08-30 ENCOUNTER — Encounter: Payer: Self-pay | Admitting: Allergy and Immunology

## 2022-01-16 ENCOUNTER — Other Ambulatory Visit: Payer: Self-pay | Admitting: Obstetrics and Gynecology

## 2022-01-16 DIAGNOSIS — M810 Age-related osteoporosis without current pathological fracture: Secondary | ICD-10-CM

## 2022-03-03 ENCOUNTER — Other Ambulatory Visit: Payer: Self-pay | Admitting: Cardiology

## 2022-05-05 ENCOUNTER — Encounter: Payer: Self-pay | Admitting: Cardiology

## 2022-05-05 ENCOUNTER — Ambulatory Visit: Payer: Medicare PPO | Attending: Cardiology | Admitting: Cardiology

## 2022-05-05 VITALS — BP 130/80 | HR 94 | Ht 63.2 in | Wt 167.0 lb

## 2022-05-05 DIAGNOSIS — E785 Hyperlipidemia, unspecified: Secondary | ICD-10-CM

## 2022-05-05 DIAGNOSIS — I119 Hypertensive heart disease without heart failure: Secondary | ICD-10-CM

## 2022-05-05 DIAGNOSIS — I491 Atrial premature depolarization: Secondary | ICD-10-CM

## 2022-05-05 DIAGNOSIS — I493 Ventricular premature depolarization: Secondary | ICD-10-CM | POA: Diagnosis not present

## 2022-05-05 MED ORDER — SPIRONOLACTONE 25 MG PO TABS: 25.0000 mg | ORAL_TABLET | Freq: Every day | ORAL | 3 refills | Status: DC

## 2022-05-05 MED ORDER — VERAPAMIL HCL ER 240 MG PO CP24: 240.0000 mg | ORAL_CAPSULE | Freq: Every day | ORAL | 3 refills | Status: AC

## 2022-05-05 NOTE — Patient Instructions (Addendum)
Medication Instructions:  Your physician recommends that you continue on your current medications as directed. Please refer to the Current Medication list given to you today.  *If you need a refill on your cardiac medications before your next appointment, please call your pharmacy*   Lab Work: None If you have labs (blood work) drawn today and your tests are completely normal, you will receive your results only by: [facility] (if you have MyChart) OR A paper copy in the mail If you have any lab test that is abnormal or we need to change your treatment, we will call you to review the results.   Testing/Procedures: None   Follow-Up: At [facility], you and your health needs are our priority.  As part of our continuing mission to provide you with exceptional heart care, we have created designated Provider Care Teams.  These Care Teams include your primary Cardiologist (physician) and Advanced Practice Providers (APPs -  Physician Assistants and Nurse Practitioners) who all work together to provide you with the care you need, when you need it.  We recommend signing up for the patient portal called "MyChart".  Sign up information is provided on this After Visit Summary.  MyChart is used to connect with patients for Virtual Visits (Telemedicine).  Patients are able to view lab/test results, encounter notes, upcoming appointments, etc.  Non-urgent messages can be sent to your provider as well.   To learn more about what you can do with MyChart, go to [email or web address].    Your next appointment:   Follow up as needed  The format for your next appointment:   In Person  Provider:   Shirlee More, MD    Other Instructions None  Important Information About Sugar           [facility]  Tips to measure your blood pressure correctly  To determine whether you have hypertension, a medical professional will take a blood pressure reading. How you prepare for the  test, the position of your arm, and other factors can change a blood pressure reading by 10% or more. That could be enough to hide high blood pressure, start you on a drug you don't really need, or lead your doctor to incorrectly adjust your medications. National and international guidelines offer specific instructions for measuring blood pressure. If a doctor, nurse, or medical assistant isn't doing it right, don't hesitate to ask him or her to get with the guidelines. Here's what you can do to ensure a correct reading:  Don't drink a caffeinated beverage or smoke during the 30 minutes before the test.  Sit quietly for five minutes before the test begins.  During the measurement, sit in a chair with your feet on the floor and your arm supported so your elbow is at about heart level.  The inflatable part of the cuff should completely cover at least 80% of your upper arm, and the cuff should be placed on bare skin, not over a shirt.  Don't talk during the measurement.  Have your blood pressure measured twice, with a brief break in between. If the readings are different by 5 points or more, have it done a third time. There are times to break these rules. If you sometimes feel lightheaded when getting out of bed in the morning or when you stand after sitting, you should have your blood pressure checked while seated and then while standing to see if it falls from one position to the next. Because blood pressure varies throughout  the day, your doctor will rarely diagnose hypertension on the basis of a single reading. Instead, he or she will want to confirm the measurements on at least two occasions, usually within a few weeks of one another. The exception to this rule is if you have a blood pressure reading of 180/110 mm Hg or higher. A result this high usually calls for prompt treatment. It's also a good idea to have your blood pressure measured in both arms at least once, since the reading in one arm  (usually the right) may be higher than that in the left. A 2014 study in The American Journal of Medicine of nearly 3,400 people found average arm- to-arm differences in systolic blood pressure of about 5 points. The higher number should be used to make treatment decisions. In 2017, new guidelines from the [place], the [employer] of Cardiology, and nine other health organizations lowered the diagnosis of high blood pressure to 130/80 mm Hg or higher for all adults. The guidelines also redefined the various blood pressure categories to now include normal, elevated, Stage 1 hypertension, Stage 2 hypertension, and hypertensive crisis (see "Blood pressure categories"). Blood pressure categories  Blood pressure category SYSTOLIC (upper number)  DIASTOLIC (lower number)  Normal Less than 120 mm Hg and Less than 80 mm Hg  Elevated 120-129 mm Hg and Less than 80 mm Hg  High blood pressure: Stage 1 hypertension 130-139 mm Hg or 80-89 mm Hg  High blood pressure: Stage 2 hypertension 140 mm Hg or higher or 90 mm Hg or higher  Hypertensive crisis (consult your doctor immediately) Higher than 180 mm Hg and/or Higher than 120 mm Hg  Source: American Heart Association and American Stroke Association. For more on getting your blood pressure under control, buy Controlling Your Blood Pressure, a Special Health Report from [facility].

## 2022-05-05 NOTE — Progress Notes (Unsigned)
Cardiology Office Note:    Date:  05/05/2022   ID:  Christy Mccoy, Christy Mccoy 03/25/1956, MRN [id number]  PCP:  Algis Greenhouse, MD  Cardiologist:  Shirlee More, MD    Referring MD: Algis Greenhouse, MD    ASSESSMENT:    1. Hypertensive heart disease without heart failure   2. APC (atrial premature contractions)   3. Premature ventricular contractions   4. Hyperlipidemia, unspecified hyperlipidemia type    PLAN:    In order of problems listed above:  Well-controlled on her current antihypertensive verapamil and distal diuretic.  Labs have been following her gynecologist office-encouraged her to record her blood pressure at home several days a week with a validated device and good technique With control of her asthma her atrial ventricular arrhythmia has resolved She opted not to take lipid-lowering therapy   Next appointment: I will plan to see back in the office as needed I will refill her antihypertensives today   Medication Adjustments/Labs and Tests Ordered: Current medicines are reviewed at length with the patient today.  Concerns regarding medicines are outlined above.  No orders of the defined types were placed in this encounter.  No orders of the defined types were placed in this encounter.   Chief Complaint  Patient presents with   Annual Exam   Follow-up    History of Present Illness:    Christy Mccoy is a 66 y.o. female with a hx of asthma previously quite severe symptomatic PVCs and APCs hypertension and hypertensive heart disease with mild LV dysfunction EF 45 to 50% mild LVH mild left atrial enlargement.  Coronary arteriography showed minimal luminal irregularity and the ejection fraction was normal at that time 55%.  She was last seen 02/02/2021 much improved blood pressure well controlled and marked reduction arrhythmia with a calcium channel blocker.  Compliance with diet, lifestyle and medications: Yes  She is having trouble with sinus congestion  reflux and is going to be seen by ENT concerned to have chronic sinusitis Her asthma is improved she is playing tennis most days of the week and has no exercise intolerance shortness of breath palpitation or chest pain Blood pressures been well controlled and repeat blood pressure in the office by me is 130/78. Past Medical History:  Diagnosis Date   Acute non-recurrent sinusitis 04/07/2016   Formatting of this note might be different from the original. 2017, 2020   Allergic rhinoconjunctivitis    Allergy    per pt   APC (atrial premature contractions) 11/05/2018   Asthma    Asthmatic bronchitis with acute exacerbation 04/15/2015   Bilateral lower extremity edema 10/16/2019   Formatting of this note might be different from the original. 2021   Chest pain    Educated about COVID-19 virus infection 11/05/2018   Gastroesophageal reflux disease 04/03/2016   Hypertension    Hypertensive heart disease 07/04/2016   Hypothyroidism    Palpitations 09/09/2018   Premature ventricular contractions 07/04/2016   2020    Past Surgical History:  Procedure Laterality Date   ABDOMINAL HYSTERECTOMY  Age 86   CHOLECYSTECTOMY     COLONOSCOPY  2016   CYST REMOVAL TRUNK     FINGER SURGERY Left    Left ring finger   HERNIA REPAIR     laparoscopic cholecystectomy     LEFT HEART CATH AND CORONARY ANGIOGRAPHY N/A 10/01/2018   Procedure: LEFT HEART CATH AND CORONARY ANGIOGRAPHY;  Surgeon: Troy Sine, MD;  Location: [facility];  Service: Cardiovascular;  Laterality: N/A;   OVARIAN CYST REMOVAL  Age 89    Current Medications: Current Meds  Medication Sig   albuterol (VENTOLIN HFA) 108 (90 Base) MCG/ACT inhaler Can inhale two puffs every four to six hours as needed for cough or wheeze.   APPLE CIDER VINEGAR PO Take by mouth daily.   Ascorbic Acid (VITAMIN C PO) Take by mouth daily.   BIOTIN PO Take 2 tablets by mouth daily. Gummies   cetirizine (ZYRTEC) 10 MG tablet Take 10 mg by mouth every  evening.   ELDERBERRY PO Take 1 capsule by mouth daily.   estradiol (ESTRACE) 0.5 MG tablet Take 0.5 mg by mouth at bedtime.    famotidine (PEPCID) 40 MG tablet Take 1 tablet (40 mg total) by mouth nightly. reflux   fluticasone (FLONASE) 50 MCG/ACT nasal spray Place 1 spray into the nose daily as needed for allergies or congestion (allergies/congestion.).   fluticasone (FLOVENT HFA) 220 MCG/ACT inhaler Inhale 1-2 puffs into the lungs as needed for wheezing or shortness of breath.   levothyroxine (SYNTHROID, LEVOTHROID) 88 MCG tablet Take 88 mcg by mouth daily before breakfast.    Multiple Vitamins-Minerals (ADULT GUMMY PO) Take 2 tablets by mouth daily with lunch.   omeprazole (PRILOSEC) 40 MG capsule TAKE ONE CAPSULE EACH MORNING BEFORE BREAKFAST   spironolactone (ALDACTONE) 25 MG tablet Take 1 tablet (25 mg total) by mouth daily. Patient must keep appointment for 05/05/22 for further refills. 3 rd/final attempt   verapamil (VERELAN PM) 240 MG 24 hr capsule Take 240 mg by mouth daily.   VITAMIN D PO Take by mouth.     Allergies:   Lisinopril and Codeine   Social History   Socioeconomic History   Marital status: Married    Spouse name: Not on file   Number of children: Not on file   Years of education: Not on file   Highest education level: Not on file  Occupational History   Not on file  Tobacco Use   Smoking status: Never   Smokeless tobacco: Never  Vaping Use   Vaping Use: Never used  Substance and Sexual Activity   Alcohol use: Yes    Comment: social   Drug use: Never   Sexual activity: Not on file  Other Topics Concern   Not on file  Social History Narrative   Not on file   Social Determinants of Health   Financial Resource Strain: Not on file  Food Insecurity: Not on file  Transportation Needs: Not on file  Physical Activity: Not on file  Stress: Not on file  Social Connections: Not on file     Family History: The patient's family history includes Dementia in  her father; Diabetes in her father; Heart attack in her paternal grandfather; Lymphoma in her mother; Stroke in her maternal grandmother and paternal grandmother. There is no history of Colon cancer, Colon polyps, Esophageal cancer, Rectal cancer, or Stomach cancer. ROS:   Please see the history of present illness.    All other systems reviewed and are negative.  EKGs/Labs/Other Studies Reviewed:    The following studies were reviewed today:  EKG:  EKG ordered today and personally reviewed.  The ekg ordered today demonstrates sinus rhythm normal no PVCs or APCs  Recent Labs: Were done at her gynecologist office I cannot see them in epic Recent Lipid Panel    Component Value Date/Time   CHOL 193 02/02/2021 1652   TRIG 137 02/02/2021 1652   HDL  56 02/02/2021 1652   CHOLHDL 3.4 02/02/2021 1652   LDLCALC 113 (H) 02/02/2021 1652    Physical Exam:    VS:  BP (!) 130/90 (BP Location: Right Arm, Patient Position: Sitting, Cuff Size: Normal)   Pulse 94   Ht 5' 3.2" (1.605 m)   Wt 167 lb (75.8 kg)   SpO2 98%   BMI 29.40 kg/m     Wt Readings from Last 3 Encounters:  05/05/22 167 lb (75.8 kg)  02/23/21 168 lb 3.2 oz (76.3 kg)  02/02/21 169 lb 9.6 oz (76.9 kg)     GEN:  Well nourished, well developed in no acute distress HEENT: Normal NECK: No JVD; No carotid bruits LYMPHATICS: No lymphadenopathy CARDIAC: RRR, no murmurs, rubs, gallops RESPIRATORY:  Clear to auscultation without rales, wheezing or rhonchi  ABDOMEN: Soft, non-tender, non-distended MUSCULOSKELETAL:  No edema; No deformity  SKIN: Warm and dry NEUROLOGIC:  Alert and oriented x 3 PSYCHIATRIC:  Normal affect    Signed, Shirlee More, MD  05/05/2022 3:38 PM    [facility]

## 2022-05-23 ENCOUNTER — Telehealth: Payer: Self-pay | Admitting: Allergy and Immunology

## 2022-05-23 NOTE — Telephone Encounter (Signed)
Patient states she received a letter from her insurance stating Flovent will no longer be covered and they provided Arnuity Ellipta Powder as the alternative. INS states in order to keep patient on Flovent we will need to provide documentation as to why she needs to stay on this medication.

## 2022-05-24 NOTE — Telephone Encounter (Signed)
Left message to call the office.

## 2022-05-25 ENCOUNTER — Other Ambulatory Visit: Payer: Self-pay | Admitting: *Deleted

## 2022-05-25 MED ORDER — ARNUITY ELLIPTA 200 MCG/ACT IN AEPB: INHALATION_SPRAY | RESPIRATORY_TRACT | 5 refills | Status: DC

## 2022-05-25 NOTE — Telephone Encounter (Signed)
Zaylah informed and rx sent.

## 2022-07-14 ENCOUNTER — Other Ambulatory Visit: Payer: Medicare PPO

## 2022-11-20 ENCOUNTER — Other Ambulatory Visit: Payer: Self-pay | Admitting: Allergy and Immunology

## 2022-12-11 ENCOUNTER — Other Ambulatory Visit: Payer: Self-pay | Admitting: Allergy and Immunology

## 2022-12-11 ENCOUNTER — Encounter: Payer: Self-pay | Admitting: Allergy and Immunology

## 2022-12-11 ENCOUNTER — Ambulatory Visit: Payer: Medicare PPO | Admitting: Allergy and Immunology

## 2022-12-11 VITALS — BP 126/82 | HR 86 | Resp 16

## 2022-12-11 DIAGNOSIS — K219 Gastro-esophageal reflux disease without esophagitis: Secondary | ICD-10-CM | POA: Diagnosis not present

## 2022-12-11 DIAGNOSIS — J3089 Other allergic rhinitis: Secondary | ICD-10-CM | POA: Diagnosis not present

## 2022-12-11 DIAGNOSIS — J4540 Moderate persistent asthma, uncomplicated: Secondary | ICD-10-CM | POA: Diagnosis not present

## 2022-12-11 MED ORDER — TRELEGY ELLIPTA 200-62.5-25 MCG/ACT IN AEPB: INHALATION_SPRAY | RESPIRATORY_TRACT | 5 refills | Status: DC

## 2022-12-11 MED ORDER — AIRSUPRA 90-80 MCG/ACT IN AERO: 2.0000 | INHALATION_SPRAY | RESPIRATORY_TRACT | 2 refills | Status: DC | PRN

## 2022-12-11 MED ORDER — RYALTRIS 665-25 MCG/ACT NA SUSP: NASAL | 5 refills | Status: DC

## 2022-12-11 NOTE — Patient Instructions (Addendum)
  1.  Treat and prevent inflammation:  A. Trelegy 200 - 1 inhalation 1 time per day (empty lungs) B. Ryaltris - 2 sprays each nostril 1-2 times per day  2. Treat and prevent reflux / LPR:  A. Omeprazole 40 mg - 1 tablet in AM B. Famotidine 40 mg - 1 tablet in PM C. Minimize caffeine, chocolate, alcohol  3. If needed:  A. AirSupra - 2 inhalations every 4-6 hours B. Antihistamine  4. Further evaluation and treatment???  5. Return to clinic in 6 months or earlier if problem

## 2022-12-11 NOTE — Progress Notes (Signed)
[place] - [place] - [place] - [place] - [place]   Follow-up Note  Referring Provider: Olive Bass, MD Primary Provider: Olive Bass, MD Date of Office Visit: 12/11/2022  Subjective:   Christy Mccoy (DOB: [birth year]) is a 67 y.o. female who [facility] in re-evaluation of the following:  HPI: Christy Mccoy presents to this clinic in evaluation of asthma, allergic rhinitis, LPR.  I last saw her in this clinic 29 August 2021.  She has had a difficult time this spring with both her upper airway and her lower airway.  She is having a lot of stuffiness and she is having wheezing and coughing and when she uses a short acting bronchodilator she does not appear to respond to this agent.  She believes that her current inhaler, Arnuity, of which she is using because her insurance company eliminated her original inhaler, is not giving her the protection that she needs.  Her reflux is doing okay on her current plan.  Other than what is described above she is actually done okay since have last seen in this clinic approximately 1 year ago without the need for systemic steroid or antibiotic for any type of airway issue.  Allergies as of 12/11/2022       Reactions   Lisinopril Other (See Comments), Cough   Codeine Anxiety, Other (See Comments)   Heart race        Medication List    ADULT GUMMY PO Take 2 tablets by mouth daily with lunch.   albuterol 108 (90 Base) MCG/ACT inhaler Commonly known as: VENTOLIN HFA Can inhale two puffs every four to six hours as needed for cough or wheeze.   APPLE CIDER VINEGAR PO Take by mouth daily.   Arnuity Ellipta 200 MCG/ACT Aepb Generic drug: Fluticasone Furoate Inhale one puff once daily to prevent cough or wheeze. Rinse mouth after use.   BIOTIN PO Take 2 tablets by mouth daily. Gummies   cetirizine 10 MG tablet Commonly known as: ZYRTEC Take 10 mg by mouth every evening.    ELDERBERRY PO Take 1 capsule by mouth daily.   estradiol 0.5 MG tablet Commonly known as: ESTRACE Take 0.5 mg by mouth at bedtime.   famotidine 40 MG tablet Commonly known as: PEPCID Take 1 tablet (40 mg total) by mouth nightly. reflux   fluticasone 50 MCG/ACT nasal spray Commonly known as: FLONASE Place 1 spray into the nose daily as needed for allergies or congestion (allergies/congestion.).   levothyroxine 88 MCG tablet Commonly known as: SYNTHROID Take 88 mcg by mouth daily before breakfast.   omeprazole 40 MG capsule Commonly known as: PRILOSEC TAKE ONE CAPSULE EACH MORNING BEFORE BREAKFAST   spironolactone 25 MG tablet Commonly known as: ALDACTONE Take 1 tablet (25 mg total) by mouth daily.   verapamil 240 MG 24 hr capsule Commonly known as: VERELAN Take 1 capsule (240 mg total) by mouth daily.   VITAMIN C PO Take by mouth daily.   VITAMIN D PO Take by mouth.    Past Medical History:  Diagnosis Date   Acute non-recurrent sinusitis 04/07/2016   Formatting of this note might be different from the original. 2017, 2020   Allergic rhinoconjunctivitis    Allergy    per pt   APC (atrial premature contractions) 11/05/2018   Asthma    Asthmatic bronchitis with acute exacerbation 04/15/2015   Bilateral lower extremity edema 10/16/2019   Formatting of this note might be different  from the original. 2021   Chest pain    Educated about COVID-19 virus infection 11/05/2018   Gastroesophageal reflux disease 04/03/2016   Hypertension    Hypertensive heart disease 07/04/2016   Hypothyroidism    Palpitations 09/09/2018   Premature ventricular contractions 07/04/2016   2020    Past Surgical History:  Procedure Laterality Date   ABDOMINAL HYSTERECTOMY  Age 1   CHOLECYSTECTOMY     COLONOSCOPY  2016   CYST REMOVAL TRUNK     FINGER SURGERY Left    Left ring finger   HERNIA REPAIR     laparoscopic cholecystectomy     LEFT HEART CATH AND CORONARY ANGIOGRAPHY N/A  10/01/2018   Procedure: LEFT HEART CATH AND CORONARY ANGIOGRAPHY;  Surgeon: Lennette Bihari, MD;  [facility];  Service: Cardiovascular;  Laterality: N/A;   OVARIAN CYST REMOVAL  Age 12    Review of systems negative except as noted in HPI / PMHx or noted below:  Review of Systems  Constitutional: Negative.   HENT: Negative.    Eyes: Negative.   Respiratory: Negative.    Cardiovascular: Negative.   Gastrointestinal: Negative.   Genitourinary: Negative.   Musculoskeletal: Negative.   Skin: Negative.   Neurological: Negative.   Endo/Heme/Allergies: Negative.   Psychiatric/Behavioral: Negative.       Objective:   Vitals:   12/11/22 1343  BP: 126/82  Pulse: 86  Resp: 16  SpO2: 97%          Physical Exam Constitutional:      Appearance: She is not diaphoretic.  HENT:     Head: Normocephalic.     Right Ear: Tympanic membrane, ear canal and external ear normal.     Left Ear: Tympanic membrane, ear canal and external ear normal.     Nose: Nose normal. No mucosal edema or rhinorrhea.     Mouth/Throat:     Pharynx: Uvula midline. No oropharyngeal exudate.  Eyes:     Conjunctiva/sclera: Conjunctivae normal.  Neck:     Thyroid: No thyromegaly.     Trachea: Trachea normal. No tracheal tenderness or tracheal deviation.  Cardiovascular:     Rate and Rhythm: Normal rate and regular rhythm.     Heart sounds: Normal heart sounds, S1 normal and S2 normal. No murmur heard. Pulmonary:     Effort: No respiratory distress.     Breath sounds: Normal breath sounds. No stridor. No wheezing or rales.  Lymphadenopathy:     Head:     Right side of head: No tonsillar adenopathy.     Left side of head: No tonsillar adenopathy.     Cervical: No cervical adenopathy.  Skin:    Findings: No erythema or rash.     Nails: There is no clubbing.  Neurological:     Mental Status: She is alert.     Diagnostics: Spirometry was performed and demonstrated an FEV1 of 1.92 at 88  % of predicted.  Assessment and Plan:   1. Not well controlled moderate persistent asthma   2. Perennial allergic rhinitis   3. LPRD (laryngopharyngeal reflux disease)    1.  Treat and prevent inflammation:  A. Trelegy 200 - 1 inhalation 1 time per day (empty lungs) B. Ryaltris - 2 sprays each nostril 1-2 times per day  2. Treat and prevent reflux / LPR:  A. Omeprazole 40 mg - 1 tablet in AM B. Famotidine 40 mg - 1 tablet in PM C. Minimize caffeine, chocolate, alcohol  3.  If needed:  A. AirSupra - 2 inhalations every 4-6 hours B. Antihistamine  4. Further evaluation and treatment???  5. Return to clinic in 6 months or earlier if problem  Jovannah appears to be having some problems with inflammation of her airway and we will treat her with the combination of therapy noted above which includes a triple inhaler furlongs and a combination nasal spray for her upper airway while she continues to address the issue with reflux as noted above.  I have asked her to contact me within the next 4 weeks or so and give me an update about her response to this approach.  If she does well we will see her back in this clinic in 6 months or earlier if there is a problem.   Laurette Schimke, MD Allergy / Immunology [facility]

## 2022-12-12 ENCOUNTER — Encounter: Payer: Self-pay | Admitting: Allergy and Immunology

## 2022-12-13 ENCOUNTER — Telehealth: Payer: Self-pay | Admitting: Allergy and Immunology

## 2022-12-13 ENCOUNTER — Other Ambulatory Visit: Payer: Self-pay | Admitting: *Deleted

## 2022-12-13 MED ORDER — BUDESONIDE-FORMOTEROL FUMARATE 160-4.5 MCG/ACT IN AERO: 2.0000 | INHALATION_SPRAY | Freq: Two times a day (BID) | RESPIRATORY_TRACT | 5 refills | Status: DC

## 2022-12-13 NOTE — Telephone Encounter (Signed)
RX sent

## 2022-12-13 NOTE — Telephone Encounter (Signed)
Patient informed. Please send Symbicort to [facility]

## 2022-12-13 NOTE — Telephone Encounter (Signed)
Please advice . Thank you

## 2022-12-13 NOTE — Telephone Encounter (Signed)
Patient states every time she uses Trelegy she gags and feels a burn in her lungs. It eventually does give her relief but she does not want to go through that every time. She wants to know if Dr. Lucie Leather has an alternative she can use.

## 2023-02-26 ENCOUNTER — Other Ambulatory Visit: Payer: Self-pay | Admitting: Cardiology

## 2023-05-24 ENCOUNTER — Encounter: Payer: Self-pay | Admitting: Allergy and Immunology

## 2023-05-24 ENCOUNTER — Ambulatory Visit: Payer: Medicare PPO | Admitting: Allergy and Immunology

## 2023-05-24 VITALS — BP 144/88 | HR 88 | Resp 12 | Ht 63.2 in | Wt 173.0 lb

## 2023-05-24 DIAGNOSIS — J4540 Moderate persistent asthma, uncomplicated: Secondary | ICD-10-CM | POA: Diagnosis not present

## 2023-05-24 DIAGNOSIS — J3089 Other allergic rhinitis: Secondary | ICD-10-CM

## 2023-05-24 DIAGNOSIS — K219 Gastro-esophageal reflux disease without esophagitis: Secondary | ICD-10-CM

## 2023-05-24 DIAGNOSIS — J324 Chronic pansinusitis: Secondary | ICD-10-CM

## 2023-05-24 NOTE — Patient Instructions (Addendum)
  1.  Treat and prevent inflammation:  A. Symbicort 160 - 2 inhalations 2 times per day with spacer B. OTC Nasacort - 1 spray each nostril 2 times per day  2. Treat and prevent reflux / LPR:  A. Omeprazole 40 mg - 1 tablet in AM B. Famotidine 40 mg - 1 tablet in PM C. Minimize caffeine, chocolate, alcohol D. Replace throat clearing with swallowing/drinking maneuver E. Visit with [facility]  3. If needed:  A. AirSupra - 2 inhalations every 4-6 hours B. Antihistamine  4. Evaluation for chronic sinusitis with a sinus CT scan  5. Return to clinic in 6 months or earlier if problem

## 2023-05-24 NOTE — Progress Notes (Signed)
[place] - [place] - [place] - [place] - [place]   Follow-up Note  Referring Provider: Olive Bass, MD Primary Provider: Olive Bass, MD Date of Office Visit: 05/24/2023  Subjective:   Christy Mccoy (DOB: [birth year]) is a 67 y.o. female who [facility]/2023 in re-evaluation of the following:  HPI: Christy Mccoy presents to this clinic in evaluation of asthma, allergic rhinitis, LPR.  Her last visit to this clinic was 11 Dec 2022.  She still continues to have lots of coughing and lots of throat clearing and she has lots of nasal congestion and stuffiness and she continues to use anti-inflammatory agents for her lower airway although she has not consistently been using any nasal steroids and she consistently uses her omeprazole and famotidine for her LPR.  She has had evaluation of her throat with rhinoscopy within the past year.  Allergies as of 05/24/2023       Reactions   Lisinopril Other (See Comments), Cough   Codeine Anxiety, Other (See Comments)   Heart race        Medication List    ADULT GUMMY PO Take 2 tablets by mouth daily with lunch.   Airsupra 90-80 MCG/ACT Aero Generic drug: Albuterol-Budesonide Inhale 2 Inhalations into the lungs every 4 (four) hours as needed.   APPLE CIDER VINEGAR PO Take by mouth daily.   BIOTIN PO Take 2 tablets by mouth daily. Gummies   budesonide-formoterol 160-4.5 MCG/ACT inhaler Commonly known as: Symbicort Inhale 2 puffs into the lungs 2 (two) times daily.   cetirizine 10 MG tablet Commonly known as: ZYRTEC Take 10 mg by mouth every evening.   ELDERBERRY PO Take 1 capsule by mouth daily.   estradiol 0.5 MG tablet Commonly known as: ESTRACE Take 0.5 mg by mouth at bedtime.   famotidine 40 MG tablet Commonly known as: PEPCID Take 1 tablet (40 mg total) by mouth nightly. reflux   levothyroxine 88 MCG tablet Commonly known as: SYNTHROID Take 88 mcg by mouth daily  before breakfast.   omeprazole 40 MG capsule Commonly known as: PRILOSEC TAKE ONE CAPSULE EACH MORNING BEFORE BREAKFAST   spironolactone 25 MG tablet Commonly known as: ALDACTONE Take 1 tablet (25 mg total) by mouth daily.   verapamil 240 MG 24 hr capsule Commonly known as: VERELAN Take 1 capsule (240 mg total) by mouth daily.   VITAMIN C PO Take by mouth daily.   VITAMIN D PO Take by mouth.    Past Medical History:  Diagnosis Date   Acute non-recurrent sinusitis 04/07/2016   Formatting of this note might be different from the original. 2017, 2020   Allergic rhinoconjunctivitis    Allergy    per pt   APC (atrial premature contractions) 11/05/2018   Asthma    Asthmatic bronchitis with acute exacerbation 04/15/2015   Bilateral lower extremity edema 10/16/2019   Formatting of this note might be different from the original. 2021   Chest pain    Educated about COVID-19 virus infection 11/05/2018   Gastroesophageal reflux disease 04/03/2016   Hypertension    Hypertensive heart disease 07/04/2016   Hypothyroidism    Palpitations 09/09/2018   Premature ventricular contractions 07/04/2016   2020    Past Surgical History:  Procedure Laterality Date   ABDOMINAL HYSTERECTOMY  Age 68   CHOLECYSTECTOMY     COLONOSCOPY  2016   CYST REMOVAL TRUNK     FINGER SURGERY Left    Left ring finger  HERNIA REPAIR     laparoscopic cholecystectomy     LEFT HEART CATH AND CORONARY ANGIOGRAPHY N/A 10/01/2018   Procedure: LEFT HEART CATH AND CORONARY ANGIOGRAPHY;  Surgeon: Lennette Bihari, MD;  [facility];  Service: Cardiovascular;  Laterality: N/A;   OVARIAN CYST REMOVAL  Age 52    Review of systems negative except as noted in HPI / PMHx or noted below:  Review of Systems  Constitutional: Negative.   HENT: Negative.    Eyes: Negative.   Respiratory: Negative.    Cardiovascular: Negative.   Gastrointestinal: Negative.   Genitourinary: Negative.   Musculoskeletal:  Negative.   Skin: Negative.   Neurological: Negative.   Endo/Heme/Allergies: Negative.   Psychiatric/Behavioral: Negative.       Objective:   Vitals:   05/24/23 1527 05/24/23 1605  BP: (!) 142/98 (!) 144/88  Pulse: 88   Resp: 12   SpO2: 96%    Height: 5' 3.2" (160.5 cm)  Weight: 173 lb (78.5 kg)   Physical Exam Constitutional:      Appearance: She is not diaphoretic.     Comments: Throat clearing, cough  HENT:     Head: Normocephalic.     Right Ear: Tympanic membrane, ear canal and external ear normal.     Left Ear: Tympanic membrane, ear canal and external ear normal.     Nose: Nose normal. No mucosal edema or rhinorrhea.     Mouth/Throat:     Pharynx: Uvula midline. No oropharyngeal exudate.  Eyes:     Conjunctiva/sclera: Conjunctivae normal.  Neck:     Thyroid: No thyromegaly.     Trachea: Trachea normal. No tracheal tenderness or tracheal deviation.  Cardiovascular:     Rate and Rhythm: Normal rate and regular rhythm.     Heart sounds: Normal heart sounds, S1 normal and S2 normal. No murmur heard. Pulmonary:     Effort: No respiratory distress.     Breath sounds: Normal breath sounds. No stridor. No wheezing or rales.  Lymphadenopathy:     Head:     Right side of head: No tonsillar adenopathy.     Left side of head: No tonsillar adenopathy.     Cervical: No cervical adenopathy.  Skin:    Findings: No erythema or rash.     Nails: There is no clubbing.  Neurological:     Mental Status: She is alert.     Diagnostics:    Spirometry was performed and demonstrated an FEV1 of 1.99 at 91 % of predicted.  Results of the chest x-ray obtained 14 January 2023 identifies the following:  The heart size and mediastinal contours are within normal limits.  Both lungs are clear. The visualized skeletal structures are  unremarkable.   Assessment and Plan:   1. Asthma, moderate persistent, well-controlled   2. Perennial allergic rhinitis   3. LPRD (laryngopharyngeal  reflux disease)   4. Chronic pansinusitis    1.  Treat and prevent inflammation:  A. Symbicort 160 - 2 inhalations 2 times per day with spacer B. OTC Nasacort - 1 spray each nostril 2 times per day  2. Treat and prevent reflux / LPR:  A. Omeprazole 40 mg - 1 tablet in AM B. Famotidine 40 mg - 1 tablet in PM C. Minimize caffeine, chocolate, alcohol D. Replace throat clearing with swallowing/drinking maneuver E. Visit with [facility]  3. If needed:  A. AirSupra - 2 inhalations every 4-6 hours B. Antihistamine  4. Evaluation for  chronic sinusitis with a sinus CT scan  5. Return to clinic in 6 months or earlier if problem  Phylicia appears to continue with significant irritation of her airway and we will send her up to [facility] center for a more thorough evaluation of her laryngeal structures while she continues to treat LPR and she will continue to use anti-inflammatory agents for her airway and we will rule out chronic sinusitis with a sinus CT scan.  I will contact her with the results of her scan once it is available for review.   Laurette Schimke, MD Allergy / Immunology [facility]

## 2023-05-27 ENCOUNTER — Other Ambulatory Visit: Payer: Self-pay | Admitting: Cardiology

## 2023-05-28 ENCOUNTER — Telehealth: Payer: Self-pay

## 2023-05-28 ENCOUNTER — Encounter: Payer: Self-pay | Admitting: Allergy and Immunology

## 2023-05-28 DIAGNOSIS — K219 Gastro-esophageal reflux disease without esophagitis: Secondary | ICD-10-CM

## 2023-05-28 NOTE — Telephone Encounter (Signed)
Left message for patient to call the office.  Please let her know that she is scheduled for her sinus CT scan this Friday, November 8th, needing to check in at 2:00 pm at [facility].  Please also let her know that I called the [facility] and the first appointment was not until mid February. Dr.Kozlow would like to have her try [facility] in [place] instead so she can be seen sooner.  If she is agreeable to go there, please let her know that I will send referral and they will reach out to her to schedule the appointment.   Note: Sinus CT approval number [id number], valid 05/29/23 to 07/28/23. Tracking number [id number]

## 2023-05-29 NOTE — Telephone Encounter (Signed)
I informed patient of [name]'s message regarding her scheduled CT scan. Patient also agreed to see [facility].

## 2023-05-30 NOTE — Telephone Encounter (Signed)
Epic referral sent to [facility].

## 2023-05-30 NOTE — Addendum Note (Signed)
Addended by: Alphonzo Cruise on: 05/30/2023 11:33 AM   Modules accepted: Orders

## 2023-06-04 ENCOUNTER — Encounter (INDEPENDENT_AMBULATORY_CARE_PROVIDER_SITE_OTHER): Payer: Self-pay | Admitting: Otolaryngology

## 2023-06-25 ENCOUNTER — Telehealth: Payer: Self-pay

## 2023-06-25 ENCOUNTER — Encounter: Payer: Self-pay | Admitting: *Deleted

## 2023-06-25 NOTE — Telephone Encounter (Signed)
Called and informed her that Dr. Lucie Leather received her sinus ct scan and it was without sinusitis.  Patient does have upcoming appointment with [facility] in January.  She still has runny nose and congestion now.

## 2023-07-10 ENCOUNTER — Other Ambulatory Visit: Payer: Self-pay | Admitting: Cardiology

## 2023-07-26 ENCOUNTER — Ambulatory Visit (INDEPENDENT_AMBULATORY_CARE_PROVIDER_SITE_OTHER): Payer: PPO | Admitting: Otolaryngology

## 2023-07-26 ENCOUNTER — Encounter (INDEPENDENT_AMBULATORY_CARE_PROVIDER_SITE_OTHER): Payer: Self-pay | Admitting: Otolaryngology

## 2023-07-26 VITALS — BP 138/82 | HR 99 | Ht 64.0 in | Wt 168.0 lb

## 2023-07-26 DIAGNOSIS — K219 Gastro-esophageal reflux disease without esophagitis: Secondary | ICD-10-CM

## 2023-07-26 DIAGNOSIS — J4530 Mild persistent asthma, uncomplicated: Secondary | ICD-10-CM

## 2023-07-26 DIAGNOSIS — R0982 Postnasal drip: Secondary | ICD-10-CM

## 2023-07-26 DIAGNOSIS — R053 Chronic cough: Secondary | ICD-10-CM

## 2023-07-26 DIAGNOSIS — R09A2 Foreign body sensation, throat: Secondary | ICD-10-CM

## 2023-07-26 DIAGNOSIS — J3089 Other allergic rhinitis: Secondary | ICD-10-CM

## 2023-07-26 DIAGNOSIS — R0981 Nasal congestion: Secondary | ICD-10-CM | POA: Diagnosis not present

## 2023-07-26 MED ORDER — LEVOCETIRIZINE DIHYDROCHLORIDE 5 MG PO TABS: 5.0000 mg | ORAL_TABLET | Freq: Every evening | ORAL | 0 refills | Status: AC

## 2023-07-26 NOTE — Progress Notes (Signed)
 ENT CONSULT:  Reason for Consult: chronic cough, nasal congestion and post-nasal drainage, dysphonia and throat clearing/globus   HPI: Discussed the use of AI scribe software for clinical note transcription with the patient, who gave verbal consent to proceed.  History of Present Illness   The patient is a 68 yoF, with a history of asthma and acid reflux, presents with chronic nasal congestion and postnasal drainage. They report a constant need to clear their throat and a sensation of something in their throat when swallowing. They also note a hoarse voice, which is not their usual tone. The patient has been on Pepcid and Prilosec for several years to manage their acid reflux. They also use an inhaler for their asthma, taking two puffs in the morning and two at night.  The patient has a history of sinus infections, with approximately one per year, but none recently. They have also had allergy testing in the past, which showed positive results for some allergens. They recently started using Flonase for their nasal congestion and take Zyrtec and Benadryl for allergies, but report that the Benadryl does not seem to help.  The patient also reports an ulcer in their throat, which they can see and feel (right side near mandibular molar). They used to get these frequently when they were younger. They have had a scope down their throat by another specialist in [place], who found nothing abnormal. Despite this, the patient continues to experience symptoms.  The patient is physically active, playing tennis three to four days a week, and reports that their asthma does not flare up during these activities. They have a deviated septum on the left side, which they believe contributes to their nasal congestion. Despite this, they report that their right nostril feels more congested than the left.  The patient has not had any upper endoscopy or sinus/nasal surgery. They have not been on allergy shots, despite  positive allergy testing in the past. They have not seen a GI doctor before.     Records Reviewed:  PCP office visit 01/10/23 seen for chronic cough 1. Chronic cough 01/10/2023 - continue reflux and allergy/asthma treatment - chest xray - - - XR Chest 2 Views; Future    Past Medical History:  Diagnosis Date   Acute non-recurrent sinusitis 04/07/2016   Formatting of this note might be different from the original. 2017, 2020   Allergic rhinoconjunctivitis    Allergy    per pt   APC (atrial premature contractions) 11/05/2018   Asthma    Asthmatic bronchitis with acute exacerbation 04/15/2015   Bilateral lower extremity edema 10/16/2019   Formatting of this note might be different from the original. 2021   Chest pain    Educated about COVID-19 virus infection 11/05/2018   Gastroesophageal reflux disease 04/03/2016   Hypertension    Hypertensive heart disease 07/04/2016   Hypothyroidism    Palpitations 09/09/2018   Premature ventricular contractions 07/04/2016   2020    Past Surgical History:  Procedure Laterality Date   ABDOMINAL HYSTERECTOMY  Age 68   CHOLECYSTECTOMY     COLONOSCOPY  2016   CYST REMOVAL TRUNK     FINGER SURGERY Left    Left ring finger   HERNIA REPAIR     laparoscopic cholecystectomy     LEFT HEART CATH AND CORONARY ANGIOGRAPHY N/A 10/01/2018   Procedure: LEFT HEART CATH AND CORONARY ANGIOGRAPHY;  Surgeon: Burnard Debby LABOR, MD;  Location: [facility];  Service: Cardiovascular;  Laterality: N/A;  OVARIAN CYST REMOVAL  Age 68    Family History  Problem Relation Age of Onset   Diabetes Father    Dementia Father    Lymphoma Mother    Stroke Maternal Grandmother    Stroke Paternal Grandmother    Heart attack Paternal Grandfather    Colon cancer Neg Hx    Colon polyps Neg Hx    Esophageal cancer Neg Hx    Rectal cancer Neg Hx    Stomach cancer Neg Hx     Social History:  reports that she has never smoked. She has never used smokeless tobacco. She  reports current alcohol use. She reports that she does not use drugs.  Allergies:  Allergies  Allergen Reactions   Lisinopril Other (See Comments) and Cough   Codeine Anxiety and Other (See Comments)    Heart race    Medications: I have reviewed the patient's current medications.  The PMH, PSH, Medications, Allergies, and SH were reviewed and updated.  ROS: Constitutional: Negative for fever, weight loss and weight gain. Cardiovascular: Negative for chest pain and dyspnea on exertion. Respiratory: Is not experiencing shortness of breath at rest. Gastrointestinal: Negative for nausea and vomiting. Neurological: Negative for headaches. Psychiatric: The patient is not nervous/anxious  Blood pressure 138/82, pulse 99, height 5' 4 (1.626 m), weight 168 lb (76.2 kg), SpO2 99%.  PHYSICAL EXAM:  Exam: General: Well-developed, well-nourished Respiratory Respiratory effort: Equal inspiration and expiration without stridor Cardiovascular Peripheral Vascular: Warm extremities with equal color/perfusion Eyes: No nystagmus with equal extraocular motion bilaterally Neuro/Psych/Balance: Patient oriented to person, place, and time; Appropriate mood and affect; Gait is intact with no imbalance; Cranial nerves I-XII are intact Head and Face Inspection: Normocephalic and atraumatic without mass or lesion Palpation: Facial skeleton intact without bony stepoffs Salivary Glands: No mass or tenderness Facial Strength: Facial motility symmetric and full bilaterally ENT Pinna: External ear intact and fully developed External canal: Canal is patent with intact skin Tympanic Membrane: Clear and mobile External Nose: No scar or anatomic deformity Internal Nose: Septum is S-shaped. No polyp, or purulence. Mucosal edema and erythema present.  Bilateral inferior turbinate hypertrophy.  Lips, Teeth, and gums: Mucosa and teeth intact and viable TMJ: No pain to palpation with full mobility Oral  cavity/oropharynx: No erythema or exudate, no lesions present Nasopharynx: No mass or lesion with intact mucosa Hypopharynx: Intact mucosa without pooling of secretions Larynx Glottic: Full true vocal cord mobility without lesion or mass Supraglottic: Normal appearing epiglottis and AE folds Interarytenoid Space: Moderate pachydermia&edema Subglottic Space: Patent without lesion or edema Neck Neck and Trachea: Midline trachea without mass or lesion Thyroid: No mass or nodularity Lymphatics: No lymphadenopathy  Procedure: PROCEDURE NOTE: nasal endoscopy  Preoperative diagnosis: chronic sinusitis symptoms  Postoperative diagnosis: same  Procedure: Diagnostic nasal endoscopy (68768)  Surgeon: Elena Larry, M.D.  Anesthesia: Topical lidocaine  and Afrin  H&P REVIEW: The patient's history and physical were reviewed today prior to procedure. All medications were reviewed and updated as well. Complications: None Condition is stable throughout exam Indications and consent: The patient presents with symptoms of chronic sinusitis not responding to previous therapies. All the risks, benefits, and potential complications were reviewed with the patient preoperatively and informed consent was obtained. The time out was completed with confirmation of the correct procedure.   Procedure: The patient was seated upright in the clinic. Topical lidocaine  and Afrin were applied to the nasal cavity. After adequate anesthesia had occurred, the rigid nasal endoscope was passed into the nasal  cavity. The nasal mucosa, turbinates, septum, and sinus drainage pathways were visualized bilaterally. This revealed no purulence or significant secretions that might be cultured. There were no polyps or sites of significant inflammation. The mucosa was intact and there was no crusting present. The scope was then slowly withdrawn and the patient tolerated the procedure well. There were no complications or blood  loss.   Studies Reviewed: CT sinuses done at the outside facility 06/20/23 Paranasal sinuses and nasal cavities are clear - only radiology report is available for review   Assessment/Plan: Encounter Diagnoses  Name Primary?   Chronic GERD Yes   Chronic nasal congestion    Chronic cough    Mild persistent asthma without complication    Post-nasal drip    Environmental and seasonal allergies    Globus sensation     Assessment and Plan    Chronic Nasal Congestion Chronic nasal congestion with positive allergy testing, symptoms of nasal stuffiness, postnasal drainage, and ear fullness. CT scan negative for sinusitis. Examination revealed a left-sided deviated septum without significant obstruction. Likely due to inflammation and allergic rhinitis. 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 Patient was not able to tolerate flexible laryngoscopy exam today but reports having flexible laryngoscopy recently in [place] and it was reportedly normal  - Monitor symptoms - Consider further evaluation if symptoms persist or worsen  Gastroesophageal Reflux Disease (GERD) Long-standing GERD managed with Pepcid and Prilosec. Symptoms include throat clearing, chronic cough, and sensation of a lump in the throat. Previous scope negative. Possible hyperirritable larynx but less likely due to lack of significant cough. Recommended dietary changes and Reflux Gourmet. - Continue Pepcid and Prilosec - Recommend dietary changes - Introduce Reflux Gourmet (sweet paste) after meals - Provide information on lifestyle changes and cookbooks for reflux management  Follow-up - Schedule follow-up appointment in six months or as needed. - will do  flexible laryngoscopy when she returns    Thank you for allowing me to participate in the care of this patient. Please do not hesitate to contact me with any questions or concerns.   Elena Larry, MD Otolaryngology [facility] Phone: [phone] Fax: [phone]    07/26/2023, 9:10 PM

## 2023-07-26 NOTE — Patient Instructions (Addendum)
 GamingLesson.nl - check out this website to learn more about reflux   -Avoid lying down for at least two hours after a meal or after drinking acidic beverages, like soda, or other caffeinated beverages. This can help to prevent stomach contents from flowing back into the esophagus. -Keep your head elevated while you sleep. Using an extra pillow or two can also help to prevent reflux. -Eat smaller and more frequent meals each day instead of a few large meals. This promotes digestion and can aid in preventing heartburn. -Wear loose-fitting clothes to ease pressure on the stomach, which can worsen heartburn and reflux. -Reduce excess weight around the midsection. This can ease pressure on the stomach. Such pressure can force some stomach contents back up the esophagus   - Take Reflux Gourmet (natural supplement available on Amazon) to help with symptoms of chronic throat irritation      Aureliano Med Nasal Saline Rinse   - start nasal saline rinses with NeilMed Bottle available over the counter or online to help with nasal congestion

## 2023-08-09 ENCOUNTER — Other Ambulatory Visit: Payer: Self-pay | Admitting: Cardiology

## 2023-08-27 ENCOUNTER — Other Ambulatory Visit: Payer: Self-pay | Admitting: Allergy and Immunology

## 2023-10-04 ENCOUNTER — Other Ambulatory Visit: Payer: Self-pay | Admitting: *Deleted

## 2023-10-04 MED ORDER — BUDESONIDE-FORMOTEROL FUMARATE 160-4.5 MCG/ACT IN AERO: INHALATION_SPRAY | RESPIRATORY_TRACT | 1 refills | Status: DC

## 2023-10-09 ENCOUNTER — Other Ambulatory Visit: Payer: Self-pay | Admitting: Cardiology

## 2023-10-09 NOTE — Telephone Encounter (Signed)
 Rx refill sent to pharmacy.

## 2023-11-08 ENCOUNTER — Other Ambulatory Visit: Payer: Self-pay | Admitting: Cardiology

## 2023-12-10 ENCOUNTER — Other Ambulatory Visit: Payer: Self-pay | Admitting: Cardiology

## 2024-01-07 ENCOUNTER — Other Ambulatory Visit: Payer: Self-pay | Admitting: Cardiology

## 2024-01-08 ENCOUNTER — Encounter: Payer: Self-pay | Admitting: Physician Assistant

## 2024-02-04 ENCOUNTER — Other Ambulatory Visit: Payer: Self-pay | Admitting: Allergy and Immunology

## 2024-02-29 ENCOUNTER — Encounter: Payer: Self-pay | Admitting: Physician Assistant

## 2024-02-29 ENCOUNTER — Ambulatory Visit: Admitting: Physician Assistant

## 2024-02-29 VITALS — BP 128/78 | HR 92 | Ht 63.0 in | Wt 171.0 lb

## 2024-02-29 DIAGNOSIS — Z8719 Personal history of other diseases of the digestive system: Secondary | ICD-10-CM

## 2024-02-29 DIAGNOSIS — Z860101 Personal history of adenomatous and serrated colon polyps: Secondary | ICD-10-CM | POA: Diagnosis not present

## 2024-02-29 DIAGNOSIS — K219 Gastro-esophageal reflux disease without esophagitis: Secondary | ICD-10-CM | POA: Diagnosis not present

## 2024-02-29 DIAGNOSIS — R09A2 Foreign body sensation, throat: Secondary | ICD-10-CM

## 2024-02-29 NOTE — Patient Instructions (Addendum)
 You have been scheduled for an endoscopy. Please follow written instructions given to you at your visit today.  If you use inhalers (even only as needed), please bring them with you on the day of your procedure.  If you take any of the following medications, they will need to be adjusted prior to your procedure:   DO NOT TAKE 7 DAYS PRIOR TO TEST- Trulicity (dulaglutide) Ozempic, Wegovy (semaglutide) Mounjaro (tirzepatide) Bydureon Bcise (exanatide extended release)  DO NOT TAKE 1 DAY PRIOR TO YOUR TEST Rybelsus (semaglutide) Adlyxin (lixisenatide) Victoza (liraglutide) Byetta (exanatide) ___________________________________________________________________________  Reflux Gourmet Rescue  It is an ALGINATE THERAPY which is the only intervention that works to safeguard the esophagus by creating a protective barrier that actually stops reflux from happening. -The general directions for use are as stated on the packaging: Take 1 teaspoon (5 ml), or more as needed or as directed by your physician, after meals and before bed. -These general directions address the most common times for reflux to occur, but our Rescue products may be taken anytime. Some individuals may take our product preemptively, when they know they will suffer from reflux, or as needed - when discomfort arises. (If taken around food, it should be consumed last.) -You do not have to take 1 teaspoon (5 ml) of the product. While one teaspoon (5ml) may be the perfect average amount to relieve reflux suffering in some, others may require more or less. You may adjust the amount of Mint Chocolate Rescue and Vanilla Caramel Rescue to the lowest amount necessary to meet your individual needs to improve your quality of life. -You may dilute the product if it is too viscous for you to consume. Keep in mind, however, that the thickness of the product was formulated to provide optimal coating and protection of your throat and esophagus.  Though diluting the product is possible, it may reduce the protective function and/or length of action. -This can be used in conjunction with reflux medications and lifestyle changes.  100% ALL-NATURAL  Paraben FREE, glycerin FREE, & potassium FREE  Made entirely from all-natural ingredients considered safe for children and during pregnancy  No known side effects  All-natural flavor Gluten FREE  Allergen FREE  Vegan  Can find more information here: NameSeizer.co.nz  Silent reflux: Not all heartburn burns...SABRASABRASABRA  What is LPR? Laryngopharyngeal reflux (LPR) or silent reflux is a condition in which acid that is made in the stomach travels up the esophagus (swallowing tube) and gets to the throat. Not everyone with reflux has a lot of heartburn or indigestion. In fact, many people with LPR never have heartburn. This is why LPR is called SILENT REFLUX, and the terms Silent reflux and LPR are often used interchangeably. Because LPR is silent, it is sometimes difficult to diagnose.  How can you tell if you have LPR?  Chronic hoarseness- Some people have hoarseness that comes and goes throat clearing  Cough It can cause shortness of breath and cause asthma like symptoms. a feeling of a lump in the throat  difficulty swallowing a problem with too much nose and throat drainage.  Some people will feel their esophagus spasm which feels like their heart beating hard and fast, this will usually be after a meal, at rest, or lying down at night.    How do I treat this? Treatment for LPR should be individualized, and your doctor will suggest the best treatment for you. Generally there are several treatments for LPR: changing habits and diet to reduce  reflux,  medications to reduce stomach acid, and  surgery to prevent reflux. Most people with LPR need to modify how and when they eat, as well as take some medication, to get well. Sometimes, nonprescription  liquid antacids, such as Maalox, Gelucil and Mylanta are recommended. When used, these antacids should be taken four times each day - one tablespoon one hour after each meal and before bedtime. Dietary and lifestyle changes alone are not often enough to control LPR - medications that reduce stomach acid are also usually needed. These must be prescribed by our doctor.   TIPS FOR REDUCING REFLUX AND LPR Control your LIFE-STYLE and your DIET! If you use tobacco, QUIT.  Smoking makes you reflux. After every cigarette you have some LPR.  Don't wear clothing that is too tight, especially around the waist (trousers, corsets, belts).  Do not lie down just after eating...in fact, do not eat within three hours of bedtime.  You should be on a low-fat diet.  Limit your intake of red meat.  Limit your intake of butter.  Avoid fried foods.  Avoid chocolate  Avoid cheese.  Avoid eggs. Specifically avoid caffeine (especially coffee and tea), soda pop (especially cola) and mints.  Avoid alcoholic beverages, particularly in the evening. I appreciate the  opportunity to care for you  Thank You   [facility]

## 2024-02-29 NOTE — Progress Notes (Signed)
 02/29/2024 Christy Mccoy [id number] [birth year]  Referring provider: Ofilia Lamar CROME, MD Primary GI doctor: Dr. 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 I discussed risks of EGD with patient today, including risk of sedation, bleeding or perforation. Patient provides understanding and gave verbal consent to proceed. - continue to follow up with allergy/asthma doctor, consider voice therapy   Personal history of colon polyps 10/23/2019 colonoscopy with Dr. Charlanne good bowel prep, pancolonic diverticulosis predominantly sigmoid colon nonbleeding internal hemorrhoids normal TI 4 polyps 4 to 6 mm in size rectum sigmoid descending and cecum.,  2 of which were TA polyps.  Recall 7 years  Nonobstructive CAD Catheterization 2020 for chest pain minimal 10% irregularities in LAD otherwise normal ejection fraction 55%.  Follows with Dr. Redell Leiter  Patient Care Team: Ofilia Lamar CROME, MD as PCP - General (Family Medicine)  HISTORY OF PRESENT ILLNESS: 68 y.o. female with a past medical history listed below presents for evaluation of GERD.   Discussed the use of AI scribe software for clinical note transcription with the patient, who gave verbal consent to proceed.  History of Present Illness   Christy Mccoy is a 68 year old female with asthma and GERD who presents with hoarseness and concerns about vocal cord damage. She was referred by her asthma doctor for evaluation of her vocal cord  concerns.  She has a history of asthma and GERD, managed with long-term medications including Prilosec and Pepcid. She is concerned about potential damage from long-term use of these medications and wonders if they have affected her vocal cords. Previously a first soprano, she now experiences hoarseness and coughing when singing or speaking extensively.  She describes a sensation of food moving slowly down her throat and occasional coughing, especially at night or with extensive voice use. No regurgitation or abdominal pain. She uses an inhaler twice daily and has an emergency inhaler for asthma attacks, which start with a cough and occur more frequently at night.  She recounts a previous unpleasant experience with an ENT in January, where a nasal endoscopy was performed, which she found significantly uncomfortable. She is concerned about a throat ulcer and reports that the ENT did not explain what the lesion was.  She reports a sensation of 'gunk' in her throat, which she sometimes coughs up. She has had a CT scan of her sinuses but remains unsure about the cause of her symptoms.  She consumes a glass of wine daily and takes Zyrtec for allergies. She is active, playing tennis four days a week and going to the gym.      She  reports that she has never smoked. She has never used smokeless tobacco. She reports current alcohol use. She reports that she does not use drugs.  RELEVANT GI HISTORY, IMAGING AND LABS: Results   RADIOLOGY Sinus CT: No significant findings  DIAGNOSTIC Colonoscopy: Diverticulosis, hemorrhoids, two tubular adenomatous polyps (precancerous) (10/2019) Laryngoscopy: Vocal cord damage, no throat ulcer, tonsillolith, erythema, small ulcer above tonsil (07/2023)      CBC  Component Value Date/Time   WBC 7.2 02/25/2021 1405   RBC 4.95 02/25/2021 1405   HGB 14.7 02/25/2021 1405   HCT 44.2 02/25/2021 1405   PLT 302 02/25/2021 1405   MCV 89 02/25/2021 1405   MCH 29.7  02/25/2021 1405   MCHC 33.3 02/25/2021 1405   RDW 12.4 02/25/2021 1405   LYMPHSABS 2.1 02/25/2021 1405   EOSABS 0.3 02/25/2021 1405   BASOSABS 0.1 02/25/2021 1405   No results for input(s): HGB in the last 8760 hours.  CMP     Component Value Date/Time   NA 138 02/02/2021 1652   K 4.5 02/02/2021 1652   CL 102 02/02/2021 1652   CO2 24 02/02/2021 1652   GLUCOSE 94 02/02/2021 1652   BUN 16 02/02/2021 1652   CREATININE 0.88 02/02/2021 1652   CALCIUM 9.3 02/02/2021 1652   PROT 6.7 02/02/2021 1652   ALBUMIN 4.3 02/02/2021 1652   AST 21 02/02/2021 1652   ALT 27 02/02/2021 1652   ALKPHOS 87 02/02/2021 1652   BILITOT 0.3 02/02/2021 1652   GFRNONAA 78 09/27/2018 1317   GFRAA 90 09/27/2018 1317      Latest Ref Rng & Units 02/02/2021    4:52 PM  Hepatic Function  Total Protein 6.0 - 8.5 g/dL 6.7   Albumin 3.8 - 4.8 g/dL 4.3   AST 0 - 40 IU/L 21   ALT 0 - 32 IU/L 27   Alk Phosphatase 44 - 121 IU/L 87   Total Bilirubin 0.0 - 1.2 mg/dL 0.3       Current Medications:   Current Outpatient Medications (Endocrine & Metabolic):    estradiol (ESTRACE) 0.5 MG tablet, Take 0.5 mg by mouth at bedtime.    levothyroxine (SYNTHROID) 100 MCG tablet, Take 100 mcg by mouth daily before breakfast.  Current Outpatient Medications (Cardiovascular):    spironolactone  (ALDACTONE ) 25 MG tablet, Take 1 tablet (25 mg total) by mouth daily. Patient needs appointment for further refills. 3 rd/final attempt   verapamil  (VERELAN  PM) 240 MG 24 hr capsule, Take 1 capsule (240 mg total) by mouth daily.  Current Outpatient Medications (Respiratory):    Albuterol -Budesonide  (AIRSUPRA ) 90-80 MCG/ACT AERO, Inhale 2 Inhalations into the lungs every 4 (four) hours as needed.   budesonide -formoterol  (SYMBICORT ) 160-4.5 MCG/ACT inhaler, INHALE 2 PUFFS BY MOUTH 2 TIMES A DAY TOPREVENT COUGH OR WHEEZE   levocetirizine (XYZAL  ALLERGY 24HR) 5 MG tablet, Take 1 tablet (5 mg total) by mouth every  evening.    Current Outpatient Medications (Other):    APPLE CIDER VINEGAR PO, Take by mouth daily.   Ascorbic Acid (VITAMIN C PO), Take by mouth daily.   BIOTIN PO, Take 2 tablets by mouth daily. Gummies   ELDERBERRY PO, Take 1 capsule by mouth daily.   famotidine (PEPCID) 40 MG tablet, Take 1 tablet (40 mg total) by mouth nightly. reflux   mometasone (ELOCON) 0.1 % cream, Apply 1 Application topically daily.   Multiple Vitamins-Minerals (ADULT GUMMY PO), Take 2 tablets by mouth daily with lunch.   omeprazole  (PRILOSEC) 40 MG capsule, TAKE ONE CAPSULE EACH MORNING BEFORE BREAKFAST   VITAMIN D PO, Take by mouth.  Medical History:  Past Medical History:  Diagnosis Date   Acute non-recurrent sinusitis 04/07/2016   Formatting of this note might be different from the original. 2017, 2020   Allergic rhinoconjunctivitis    Allergy    per pt   APC (atrial premature contractions) 11/05/2018   Asthma    Asthmatic bronchitis with acute  exacerbation 04/15/2015   Bilateral lower extremity edema 10/16/2019   Formatting of this note might be different from the original. 2021   Chest pain    Colon polyps    Diverticulitis    Educated about COVID-19 virus infection 11/05/2018   Gastroesophageal reflux disease 04/03/2016   Hypertension    Hypertensive heart disease 07/04/2016   Hypothyroidism    Palpitations 09/09/2018   Premature ventricular contractions 07/04/2016   2020   Allergies:  Allergies  Allergen Reactions   Lisinopril Other (See Comments) and Cough   Codeine Anxiety and Other (See Comments)    Heart race     Surgical History:  She  has a past surgical history that includes Ovarian cyst removal (Age 51); Abdominal hysterectomy (Age 68); Cyst removal trunk; Finger surgery (Left); LEFT HEART CATH AND CORONARY ANGIOGRAPHY (N/A, 10/01/2018); Cholecystectomy; Colonoscopy (2016); and Umbilical hernia repair. Family History:  Her family history includes Asthma in her daughter and  daughter; Dementia in her father; Diabetes in her father; Heart attack in her paternal grandfather; Lymphoma in her mother; Other in her maternal grandfather; Stroke in her maternal grandmother and paternal grandmother.  REVIEW OF SYSTEMS  : All other systems reviewed and negative except where noted in the History of Present Illness.  PHYSICAL EXAM: BP (!) 140/78 (BP Location: Left Arm, Patient Position: Sitting, Cuff Size: Normal)   Pulse 92   Ht 5' 3 (1.6 m) Comment: height measured without shoes  Wt 171 lb (77.6 kg)   BMI 30.29 kg/m  Physical Exam   GENERAL APPEARANCE: Well nourished, in no apparent distress. HEENT: Posterior pharynx appears red with a small ulcer above. Tonsil stone present on the right. No oral thrush. No cervical lymphadenopathy, unremarkable thyroid, sclerae anicteric, conjunctiva pink. RESPIRATORY: Respiratory effort normal, breath sounds equal bilaterally without rales, rhonchi, or wheezing. CARDIO: Regular rate and rhythm with no murmurs, rubs, or gallops. Peripheral pulses intact. ABDOMEN: Soft, non-distended, active bowel sounds in all four quadrants, no tenderness to palpation, no rebound, no mass appreciated. RECTAL: Declines. MUSCULOSKELETAL: Full range of motion, normal gait, without edema. SKIN: Dry, intact without rashes or lesions. No jaundice. NEURO: Alert, oriented, no focal deficits. PSYCH: Cooperative, normal mood and affect. NECK: No cervical lymphadenopathy.      Alan JONELLE Coombs, PA-C 10:14 AM

## 2024-04-03 ENCOUNTER — Other Ambulatory Visit: Payer: Self-pay | Admitting: Allergy and Immunology

## 2024-04-03 ENCOUNTER — Telehealth: Payer: Self-pay

## 2024-04-03 NOTE — Telephone Encounter (Signed)
 Rx request recvd for symbicort , request denied, office visit appt required for further refills. I called pt, lvm and advised we recvd rx request refill but she needs an OV appt with Dr. Maurilio to continue med, I provided office number for pt to schedule.

## 2024-04-04 ENCOUNTER — Other Ambulatory Visit: Payer: Self-pay | Admitting: *Deleted

## 2024-04-04 MED ORDER — BUDESONIDE-FORMOTEROL FUMARATE 160-4.5 MCG/ACT IN AERO: INHALATION_SPRAY | RESPIRATORY_TRACT | 0 refills | Status: DC

## 2024-04-21 ENCOUNTER — Ambulatory Visit: Admitting: Allergy and Immunology

## 2024-04-30 ENCOUNTER — Telehealth: Payer: Self-pay | Admitting: Physician Assistant

## 2024-04-30 NOTE — Telephone Encounter (Signed)
 Requesting f/u call to discuss prep. Please advise.

## 2024-04-30 NOTE — Telephone Encounter (Signed)
 Returned patients call, she is having an Endoscopy with Dr Charlanne. She just wanted to make sure she could still take her antihistamines before her procedure. I told her as long as she takes them 3 hours before her arrival time. I went through her medication list and told her to bring her inhalers with her to her appointment. No Medications needs to be held

## 2024-05-03 ENCOUNTER — Other Ambulatory Visit: Payer: Self-pay | Admitting: Allergy and Immunology

## 2024-05-05 ENCOUNTER — Encounter: Payer: Self-pay | Admitting: Gastroenterology

## 2024-05-07 ENCOUNTER — Ambulatory Visit: Admitting: Gastroenterology

## 2024-05-07 ENCOUNTER — Encounter: Payer: Self-pay | Admitting: Gastroenterology

## 2024-05-07 VITALS — BP 132/89 | HR 107 | Temp 97.5°F | Resp 14 | Ht 63.0 in | Wt 171.0 lb

## 2024-05-07 DIAGNOSIS — K449 Diaphragmatic hernia without obstruction or gangrene: Secondary | ICD-10-CM

## 2024-05-07 DIAGNOSIS — K2970 Gastritis, unspecified, without bleeding: Secondary | ICD-10-CM

## 2024-05-07 DIAGNOSIS — K219 Gastro-esophageal reflux disease without esophagitis: Secondary | ICD-10-CM

## 2024-05-07 MED ORDER — SODIUM CHLORIDE 0.9 % IV SOLN: 500.0000 mL | INTRAVENOUS | Status: DC

## 2024-05-07 MED ORDER — PANTOPRAZOLE SODIUM 40 MG PO TBEC: 40.0000 mg | DELAYED_RELEASE_TABLET | Freq: Every day | ORAL | 4 refills | Status: AC

## 2024-05-07 NOTE — Progress Notes (Signed)
 1203 BP 160/120, Labetalol given IV, MD update, vss

## 2024-05-07 NOTE — Progress Notes (Signed)
 1147 HR > 100 with esmolol  25 mg given IV, MD updated, vss

## 2024-05-07 NOTE — Progress Notes (Signed)
 1155 BP 197/154, Labetalol given IV, MD update, vss

## 2024-05-07 NOTE — Patient Instructions (Addendum)
 Resume previous diet.  Await pathology results.   Take Protonix 40mg  daily. Prescription sent. Stop Omeprazole . Continue Pepcid 40mg  at night.   YOU HAD AN ENDOSCOPIC PROCEDURE TODAY AT THE [facility]:   Refer to the procedure report that was given to you for any specific questions about what was found during the examination.  If the procedure report does not answer your questions, please call your gastroenterologist to clarify.  If you requested that your care partner not be given the details of your procedure findings, then the procedure report has been included in a sealed envelope for you to review at your convenience later.  YOU SHOULD EXPECT: Some feelings of bloating in the abdomen. Passage of more gas than usual.  Walking can help get rid of the air that was put into your GI tract during the procedure and reduce the bloating. If you had a lower endoscopy (such as a colonoscopy or flexible sigmoidoscopy) you may notice spotting of blood in your stool or on the toilet paper. If you underwent a bowel prep for your procedure, you may not have a normal bowel movement for a few days.  Please Note:  You might notice some irritation and congestion in your nose or some drainage.  This is from the oxygen used during your procedure.  There is no need for concern and it should clear up in a day or so.  SYMPTOMS TO REPORT IMMEDIATELY:  Following upper endoscopy (EGD)  Vomiting of blood or coffee ground material  New chest pain or pain under the shoulder blades  Painful or persistently difficult swallowing  New shortness of breath  Fever of 100F or higher  Black, tarry-looking stools  For urgent or emergent issues, a gastroenterologist can be reached at any hour by calling [phone]. Do not use MyChart messaging for urgent concerns.    DIET:  We do recommend a small meal at first, but then you may proceed to your regular diet.  Drink plenty of fluids but you should avoid  alcoholic beverages for 24 hours.  ACTIVITY:  You should plan to take it easy for the rest of today and you should NOT DRIVE or use heavy machinery until tomorrow (because of the sedation medicines used during the test).    FOLLOW UP: Our staff will call the number listed on your records the next business day following your procedure.  We will call around 7:15- 8:00 am to check on you and address any questions or concerns that you may have regarding the information given to you following your procedure. If we do not reach you, we will leave a message.     If any biopsies were taken you will be contacted by phone or by letter within the next 1-3 weeks.  Please call us  at [phone] if you have not heard about the biopsies in 3 weeks.    SIGNATURES/CONFIDENTIALITY: You and/or your care partner have signed paperwork which will be entered into your electronic medical record.  These signatures attest to the fact that that the information above on your After Visit Summary has been reviewed and is understood.  Full responsibility of the confidentiality of this discharge information lies with you and/or your care-partner.

## 2024-05-07 NOTE — Progress Notes (Signed)
 1140 HR > 100 with esmolol 25 mg given IV, MD updated, vss

## 2024-05-07 NOTE — Progress Notes (Signed)
 02/29/2024 Christy Mccoy [id number] May 07, 1956   Referring provider: Ofilia Lamar CROME, MD Primary GI doctor: Dr. 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 I discussed risks of EGD with patient today, including risk of sedation, bleeding or perforation. Patient provides understanding and gave verbal consent to proceed. - continue to follow up with allergy/asthma doctor, consider voice therapy    Personal history of colon polyps 10/23/2019 colonoscopy with Dr. Charlanne good bowel prep, pancolonic diverticulosis predominantly sigmoid colon nonbleeding internal hemorrhoids normal TI 4 polyps 4 to 6 mm in size rectum sigmoid descending and cecum.,  2 of which were TA polyps.  Recall 7 years   Nonobstructive CAD Catheterization 2020 for chest pain minimal 10% irregularities in LAD otherwise normal ejection fraction 55%.  Follows with Dr. Redell Leiter   Patient Care Team: Ofilia Lamar CROME, MD as PCP - General (Family Medicine)   HISTORY OF PRESENT ILLNESS: 68 y.o. female with a past medical history listed below presents for evaluation of GERD.    Discussed the use of AI scribe software for clinical note transcription with the patient, who gave verbal consent to proceed.   History of Present Illness   Christy Mccoy is a 68 year old female with asthma and GERD who presents with hoarseness and concerns about vocal cord damage. She was referred by her asthma doctor for evaluation of her vocal  cord concerns.   She has a history of asthma and GERD, managed with long-term medications including Prilosec and Pepcid. She is concerned about potential damage from long-term use of these medications and wonders if they have affected her vocal cords. Previously a first soprano, she now experiences hoarseness and coughing when singing or speaking extensively.   She describes a sensation of food moving slowly down her throat and occasional coughing, especially at night or with extensive voice use. No regurgitation or abdominal pain. She uses an inhaler twice daily and has an emergency inhaler for asthma attacks, which start with a cough and occur more frequently at night.   She recounts a previous unpleasant experience with an ENT in January, where a nasal endoscopy was performed, which she found significantly uncomfortable. She is concerned about a throat ulcer and reports that the ENT did not explain what the lesion was.   She reports a sensation of 'gunk' in her throat, which she sometimes coughs up. She has had a CT scan of her sinuses but remains unsure about the cause of her symptoms.   She consumes a glass of wine daily and takes Zyrtec for allergies. She is active, playing tennis four days a week and going to the gym.       She  reports that she has never smoked. She has never used smokeless tobacco. She reports current alcohol use. She reports that she does not use drugs.   RELEVANT GI HISTORY, IMAGING AND LABS: Results   RADIOLOGY Sinus CT: No significant findings   DIAGNOSTIC Colonoscopy: Diverticulosis, hemorrhoids, two tubular adenomatous polyps (precancerous) (10/2019) Laryngoscopy: Vocal cord damage, no throat ulcer, tonsillolith,  erythema, small ulcer above tonsil (07/2023)       CBC Labs (Brief)          Component Value Date/Time    WBC 7.2 02/25/2021 1405    RBC 4.95 02/25/2021 1405    HGB 14.7 02/25/2021 1405    HCT 44.2 02/25/2021 1405    PLT 302 02/25/2021 1405     MCV 89 02/25/2021 1405    MCH 29.7 02/25/2021 1405    MCHC 33.3 02/25/2021 1405    RDW 12.4 02/25/2021 1405    LYMPHSABS 2.1 02/25/2021 1405    EOSABS 0.3 02/25/2021 1405    BASOSABS 0.1 02/25/2021 1405      Recent Labs (within last 365 days)  No results for input(s): HGB in the last 8760 hours.     CMP     Labs (Brief)          Component Value Date/Time    NA 138 02/02/2021 1652    K 4.5 02/02/2021 1652    CL 102 02/02/2021 1652    CO2 24 02/02/2021 1652    GLUCOSE 94 02/02/2021 1652    BUN 16 02/02/2021 1652    CREATININE 0.88 02/02/2021 1652    CALCIUM 9.3 02/02/2021 1652    PROT 6.7 02/02/2021 1652    ALBUMIN 4.3 02/02/2021 1652    AST 21 02/02/2021 1652    ALT 27 02/02/2021 1652    ALKPHOS 87 02/02/2021 1652    BILITOT 0.3 02/02/2021 1652    GFRNONAA 78 09/27/2018 1317    GFRAA 90 09/27/2018 1317          Latest Ref Rng & Units 02/02/2021    4:52 PM  Hepatic Function  Total Protein 6.0 - 8.5 g/dL 6.7   Albumin 3.8 - 4.8 g/dL 4.3   AST 0 - 40 IU/L 21   ALT 0 - 32 IU/L 27   Alk Phosphatase 44 - 121 IU/L 87   Total Bilirubin 0.0 - 1.2 mg/dL 0.3       Current Medications:    Current Outpatient Medications (Endocrine & Metabolic):    estradiol (ESTRACE) 0.5 MG tablet, Take 0.5 mg by mouth at bedtime.    levothyroxine (SYNTHROID) 100 MCG tablet, Take 100 mcg by mouth daily before breakfast.   Current Outpatient Medications (Cardiovascular):    spironolactone  (ALDACTONE ) 25 MG tablet, Take 1 tablet (25 mg total) by mouth daily. Patient needs appointment for further refills. 3 rd/final attempt   verapamil  (VERELAN  PM) 240 MG 24 hr capsule, Take 1 capsule (240 mg total) by mouth daily.   Current Outpatient Medications (Respiratory):    Albuterol -Budesonide  (AIRSUPRA ) 90-80 MCG/ACT AERO, Inhale 2 Inhalations into the lungs every 4 (four) hours as needed.   budesonide -formoterol  (SYMBICORT ) 160-4.5 MCG/ACT inhaler, INHALE 2 PUFFS BY MOUTH 2 TIMES A DAY  TOPREVENT COUGH OR WHEEZE   levocetirizine (XYZAL  ALLERGY 24HR) 5 MG tablet, Take 1 tablet (5 mg total) by mouth every evening.       Current Outpatient Medications (Other):    APPLE CIDER VINEGAR PO, Take by mouth daily.   Ascorbic Acid (VITAMIN C PO), Take by mouth daily.   BIOTIN PO, Take 2 tablets by mouth daily. Gummies   ELDERBERRY PO, Take 1 capsule by mouth daily.   famotidine (PEPCID) 40 MG tablet, Take 1 tablet (40 mg total) by mouth nightly. reflux   mometasone (ELOCON) 0.1 % cream, Apply 1 Application topically daily.   Multiple Vitamins-Minerals (ADULT GUMMY PO), Take 2 tablets by  mouth daily with lunch.   omeprazole  (PRILOSEC) 40 MG capsule, TAKE ONE CAPSULE EACH MORNING BEFORE BREAKFAST   VITAMIN D PO, Take by mouth.   Medical History:      Past Medical History:  Diagnosis Date   Acute non-recurrent sinusitis 04/07/2016    Formatting of this note might be different from the original. 2017, 2020   Allergic rhinoconjunctivitis     Allergy      per pt   APC (atrial premature contractions) 11/05/2018   Asthma     Asthmatic bronchitis with acute exacerbation 04/15/2015   Bilateral lower extremity edema 10/16/2019    Formatting of this note might be different from the original. 2021   Chest pain     Colon polyps     Diverticulitis     Educated about COVID-19 virus infection 11/05/2018   Gastroesophageal reflux disease 04/03/2016   Hypertension     Hypertensive heart disease 07/04/2016   Hypothyroidism     Palpitations 09/09/2018   Premature ventricular contractions 07/04/2016    2020        Allergies:  Allergies       Allergies  Allergen Reactions   Lisinopril Other (See Comments) and Cough   Codeine Anxiety and Other (See Comments)      Heart race        Surgical History:  She  has a past surgical history that includes Ovarian cyst removal (Age 89); Abdominal hysterectomy (Age 48); Cyst removal trunk; Finger surgery (Left); LEFT HEART CATH AND  CORONARY ANGIOGRAPHY (N/A, 10/01/2018); Cholecystectomy; Colonoscopy (2016); and Umbilical hernia repair. Family History:  Her family history includes Asthma in her daughter and daughter; Dementia in her father; Diabetes in her father; Heart attack in her paternal grandfather; Lymphoma in her mother; Other in her maternal grandfather; Stroke in her maternal grandmother and paternal grandmother.   REVIEW OF SYSTEMS  : All other systems reviewed and negative except where noted in the History of Present Illness.   PHYSICAL EXAM: BP (!) 140/78 (BP Location: Left Arm, Patient Position: Sitting, Cuff Size: Normal)   Pulse 92   Ht 5' 3 (1.6 m) Comment: height measured without shoes  Wt 171 lb (77.6 kg)   BMI 30.29 kg/m  Physical Exam   GENERAL APPEARANCE: Well nourished, in no apparent distress. HEENT: Posterior pharynx appears red with a small ulcer above. Tonsil stone present on the right. No oral thrush. No cervical lymphadenopathy, unremarkable thyroid, sclerae anicteric, conjunctiva pink. RESPIRATORY: Respiratory effort normal, breath sounds equal bilaterally without rales, rhonchi, or wheezing. CARDIO: Regular rate and rhythm with no murmurs, rubs, or gallops. Peripheral pulses intact. ABDOMEN: Soft, non-distended, active bowel sounds in all four quadrants, no tenderness to palpation, no rebound, no mass appreciated. RECTAL: Declines. MUSCULOSKELETAL: Full range of motion, normal gait, without edema. SKIN: Dry, intact without rashes or lesions. No jaundice. NEURO: Alert, oriented, no focal deficits. PSYCH: Cooperative, normal mood and affect. NECK: No cervical lymphadenopathy.       Alan JONELLE Coombs, PA-C   Attending physician's note   I have taken history, reviewed the chart and examined the patient. I performed a substantive portion of this encounter, including complete performance of at least one of the key components, in conjunction with the APP. I agree with the Advanced  Practitioner's note, impression and recommendations.  For EGD today  Anselm Bring, MD [facility] [phone]

## 2024-05-07 NOTE — Progress Notes (Signed)
 1144 HR > 100 with esmolol 25 mg given IV.  Wheezing and coughing with albuterol  neb given.  MD at bedside.

## 2024-05-07 NOTE — Op Note (Signed)
 [facility] Patient Name: Christy Mccoy Procedure Date: 05/07/2024 11:25 AM MRN: [id number] Endoscopist: Lynnie Bring , MD, [id number] Age: 68 Referring MD:  Date of Birth: Sep 08, 1955 Gender: Female Account #: [id number] Procedure:                Upper GI endoscopy Indications:              Heartburn Medicines:                Monitored Anesthesia Care Procedure:                Pre-Anesthesia Assessment:                           - Prior to the procedure, a History and Physical                            was performed, and patient medications and                            allergies were reviewed. The patient's tolerance of                            previous anesthesia was also reviewed. The risks                            and benefits of the procedure and the sedation                            options and risks were discussed with the patient.                            All questions were answered, and informed consent                            was obtained. Prior Anticoagulants: The patient has                            taken no anticoagulant or antiplatelet agents. ASA                            Grade Assessment: II - A patient with mild systemic                            disease. After reviewing the risks and benefits,                            the patient was deemed in satisfactory condition to                            undergo the procedure.                           After obtaining informed consent, the endoscope was  passed under direct vision. Throughout the                            procedure, the patient's blood pressure, pulse, and                            oxygen saturations were monitored continuously. The                            Olympus Scope [id number] was introduced through the                            mouth, and advanced to the second part of duodenum.                            The upper GI endoscopy was accomplished  without                            difficulty. The patient tolerated the procedure                            well. Scope In: Scope Out: Findings:                 The examined esophagus was normal. Biopsies were                            obtained from the proximal and distal esophagus                            with cold forceps for histology of suspected                            eosinophilic esophagitis.                           The Z-line was regular and was found 35 cm from the                            incisors. 1 cm transient hiatal hernia was noted                            best visualized on Valsalva.                           Localized mild inflammation characterized by                            congestion (edema) and erythema was found in the                            gastric antrum. Biopsies were taken with a cold                            forceps for histology.  The examined duodenum was normal. Biopsies for                            histology were taken with a cold forceps for                            evaluation of celiac disease. Complications:            No immediate complications. Estimated Blood Loss:     Estimated blood loss: none. Impression:               - Minimal hiatal hernia.                           - Mild gastritis. Recommendation:           - Patient has a contact number available for                            emergencies. The signs and symptoms of potential                            delayed complications were discussed with the                            patient. Return to normal activities tomorrow.                            Written discharge instructions were provided to the                            patient.                           - Resume previous diet.                           - Change omeprazole  to Protonix (pantoprazole) 40                            mg PO daily #90, 4RF. Please continue taking Pepcid                             40 mg p.o. nightly for now                           - Brochures regarding GERD.                           - The findings and recommendations were discussed                            with the patient's family.                           - Await pathology results. Lynnie Bring, MD 05/07/2024 11:53:06 AM This report has been signed electronically.

## 2024-05-07 NOTE — Progress Notes (Signed)
 1143  Pt experienced laryngeal spasm with jaw thrust performed.

## 2024-05-07 NOTE — Progress Notes (Signed)
 1150 Patient experiencing nausea, retching, and coughing.  MD updated and Zofran  4 mg IV given.

## 2024-05-07 NOTE — Progress Notes (Signed)
 1152 Pt still wheezing and coughing, albuterol  neb given. vss

## 2024-05-07 NOTE — Progress Notes (Signed)
 1206 Report given to PACU, vss

## 2024-05-07 NOTE — Progress Notes (Signed)
 1148 HR > 100 with esmolol 25 mg given IV, MD updated, vss

## 2024-05-07 NOTE — Progress Notes (Signed)
 Called to room to assist during endoscopic procedure.  Patient ID and intended procedure confirmed with present staff. Received instructions for my participation in the procedure from the performing physician.

## 2024-05-07 NOTE — Progress Notes (Signed)
1114 Robinul 0.1 mg IV given due large amount of secretions upon assessment.  MD made aware, vss

## 2024-05-08 ENCOUNTER — Telehealth: Payer: Self-pay

## 2024-05-08 NOTE — Telephone Encounter (Signed)
 Left message on follow up call.

## 2024-05-09 LAB — SURGICAL PATHOLOGY

## 2024-05-12 ENCOUNTER — Encounter: Payer: Self-pay | Admitting: Allergy and Immunology

## 2024-05-12 ENCOUNTER — Ambulatory Visit: Admitting: Allergy and Immunology

## 2024-05-12 VITALS — BP 136/82 | HR 88 | Resp 16

## 2024-05-12 DIAGNOSIS — J4540 Moderate persistent asthma, uncomplicated: Secondary | ICD-10-CM | POA: Diagnosis not present

## 2024-05-12 DIAGNOSIS — J387 Other diseases of larynx: Secondary | ICD-10-CM

## 2024-05-12 DIAGNOSIS — J3089 Other allergic rhinitis: Secondary | ICD-10-CM | POA: Diagnosis not present

## 2024-05-12 DIAGNOSIS — K219 Gastro-esophageal reflux disease without esophagitis: Secondary | ICD-10-CM | POA: Diagnosis not present

## 2024-05-12 NOTE — Patient Instructions (Signed)
  1.  Treat and prevent inflammation:  A. Symbicort  160 - 2 inhalations 2 times per day with spacer B. OTC Nasacort - 1 spray each nostril 2 times per day  2. Treat and prevent reflux / LPR:  A. Pantoprazole 40 mg - 1 tablet in AM B. Famotidine 40 mg - 1 tablet in PM C. Minimize caffeine, chocolate, alcohol D. Replace throat clearing with swallowing/drinking maneuver E. Visit with [facility]  3. If needed:  A. AirSupra  - 2 inhalations every 4-6 hours B. Antihistamine  4. Return to clinic in 6 months or earlier if problem  5. Influenza = Tamiflu. Covid = Paxlovid

## 2024-05-12 NOTE — Progress Notes (Unsigned)
 [place] - [place] - [place] - [place] - [place]   Follow-up Note  Referring Provider: Ofilia Lamar CROME, MD Primary Provider: Ofilia Lamar CROME, MD Date of Office Visit: 05/12/2024  Subjective:   Christy Mccoy (DOB: May 13, 1956) is a 68 y.o. female who [facility] in re-evaluation of the following:  HPI: Aviv returns to this clinic in evaluation of asthma, allergic rhinitis, LPR.  I last saw her in this clinic 24 May 2023.  She recently underwent upper endoscopy with Dr. Charlanne for which mild gastritis was find although all biopsies of gastrum, esophagus, duodenum were negative for significant abnormality.  During the procedure she had laryngeal spasm and a jaw thrust maneuver was performed and after the endoscopy was completed she did have a fair amount of soreness underneath her mandibles and some swelling under her mandibles and her throat was really irritated and still remains somewhat irritated since that point in time.  She still has lots of throat clearing and irritation of her throat and drainage in her throat.  Her omeprazole  was changed to pantoprazole and she continues on famotidine.  She has a tea and a soda per day and drink of wine per day but does not consume any chocolate.  She has not really been having any issues with shortness of breath or chest tightness.  She is playing tennis without any problem.  She does not use any short acting bronchodilator.  She is not really having any problems with her nose.  Sometimes she feels as though her left nostril might be a little bit congested.  She did have a rhinoscopy performed on 26 July 2023 which did not identify any significant mucosal abnormality of her upper airway.  Allergies as of 05/12/2024       Reactions   Codeine Anxiety, Other (See Comments)   Heart race   Lisinopril Other (See Comments), Cough        Medication List    ADULT GUMMY PO Take 2 tablets  by mouth daily with lunch.   Airsupra  90-80 MCG/ACT Aero Generic drug: Albuterol -Budesonide  Inhale 2 Inhalations into the lungs every 4 (four) hours as needed.   APPLE CIDER VINEGAR PO Take by mouth daily.   BIOTIN PO Take 2 tablets by mouth daily. Gummies   budesonide -formoterol  160-4.5 MCG/ACT inhaler Commonly known as: Symbicort  INHALE 2 PUFFS BY MOUTH 2 TIMES A DAY TOPREVENT COUGH OR WHEEZE   ELDERBERRY PO Take 1 capsule by mouth daily.   estradiol 0.5 MG tablet Commonly known as: ESTRACE Take 0.5 mg by mouth at bedtime.   famotidine 40 MG tablet Commonly known as: PEPCID Take 1 tablet (40 mg total) by mouth nightly. reflux   levocetirizine 5 MG tablet Commonly known as: Xyzal  Allergy 24HR Take 1 tablet (5 mg total) by mouth every evening.   levothyroxine 100 MCG tablet Commonly known as: SYNTHROID Take 100 mcg by mouth daily before breakfast.   mometasone 0.1 % cream Commonly known as: ELOCON Apply 1 Application topically daily.   pantoprazole 40 MG tablet Commonly known as: PROTONIX Take 1 tablet (40 mg total) by mouth daily.   spironolactone  25 MG tablet Commonly known as: ALDACTONE  Take 1 tablet (25 mg total) by mouth daily. Patient needs appointment for further refills. 3 rd/final attempt   verapamil  240 MG 24 hr capsule Commonly known as: VERELAN  Take 1 capsule (240 mg total) by mouth daily.   VITAMIN C PO Take by mouth daily.  VITAMIN D PO Take by mouth.    Past Medical History:  Diagnosis Date   Acute non-recurrent sinusitis 04/07/2016   Formatting of this note might be different from the original. 2017, 2020   Allergic rhinoconjunctivitis    Allergy    per pt   APC (atrial premature contractions) 11/05/2018   Asthma    Asthmatic bronchitis with acute exacerbation 04/15/2015   Bilateral lower extremity edema 10/16/2019   Formatting of this note might be different from the original. 2021   CAD (coronary artery disease) 05/06/2019    heart disease with mild LV dysfunction EF 45 to 50%   Chest pain    Colon polyps    Diverticulitis    Educated about COVID-19 virus infection 11/05/2018   Gastroesophageal reflux disease 04/03/2016   Hypertension    Hypertensive heart disease 07/04/2016   Hypothyroidism    Palpitations 09/09/2018   Premature ventricular contractions 07/04/2016   2020    Past Surgical History:  Procedure Laterality Date   ABDOMINAL HYSTERECTOMY  Age 68   CHOLECYSTECTOMY     COLONOSCOPY  2016   CYST REMOVAL TRUNK     FINGER SURGERY Left    Left ring finger   LEFT HEART CATH AND CORONARY ANGIOGRAPHY N/A 10/01/2018   Procedure: LEFT HEART CATH AND CORONARY ANGIOGRAPHY;  Surgeon: Burnard Debby LABOR, MD;  Location: [facility];  Service: Cardiovascular;  Laterality: N/A;   OVARIAN CYST REMOVAL  Age 68   UMBILICAL HERNIA REPAIR     and right inguinal hernia    Review of systems negative except as noted in HPI / PMHx or noted below:  Review of Systems  Constitutional: Negative.   HENT: Negative.    Eyes: Negative.   Respiratory: Negative.    Cardiovascular: Negative.   Gastrointestinal: Negative.   Genitourinary: Negative.   Musculoskeletal: Negative.   Skin: Negative.   Neurological: Negative.   Endo/Heme/Allergies: Negative.   Psychiatric/Behavioral: Negative.       Objective:   Vitals:   05/12/24 1035  BP: 136/82  Pulse: 88  Resp: 16  SpO2: 95%          Physical Exam Constitutional:      Appearance: She is not diaphoretic.     Comments: Raspy voice, throat clearing  HENT:     Head: Normocephalic.     Right Ear: Tympanic membrane, ear canal and external ear normal.     Left Ear: Tympanic membrane, ear canal and external ear normal.     Nose: Nose normal. No mucosal edema or rhinorrhea.     Mouth/Throat:     Pharynx: Uvula midline. No oropharyngeal exudate.  Eyes:     Conjunctiva/sclera: Conjunctivae normal.  Neck:     Thyroid: No thyromegaly.     Trachea:  Trachea normal. No tracheal tenderness or tracheal deviation.  Cardiovascular:     Rate and Rhythm: Normal rate and regular rhythm.     Heart sounds: Normal heart sounds, S1 normal and S2 normal. No murmur heard. Pulmonary:     Effort: No respiratory distress.     Breath sounds: Normal breath sounds. No stridor. No wheezing or rales.  Lymphadenopathy:     Head:     Right side of head: No tonsillar adenopathy.     Left side of head: No tonsillar adenopathy.     Cervical: No cervical adenopathy.  Skin:    Findings: No erythema or rash.     Nails: There is no clubbing.  Neurological:  Mental Status: She is alert.     Diagnostics: Spirometry was performed and demonstrated an FEV1 of 1.77 at 82 % of predicted.  Assessment and Plan:   1. Asthma, moderate persistent, well-controlled   2. Perennial allergic rhinitis   3. LPRD (laryngopharyngeal reflux disease)   4. Irritable larynx syndrome    1.  Treat and prevent inflammation:  A. Symbicort  160 - 2 inhalations 2 times per day with spacer B. OTC Nasacort - 1 spray each nostril 2 times per day  2. Treat and prevent reflux / LPR:  A. Pantoprazole 40 mg - 1 tablet in AM B. Famotidine 40 mg - 1 tablet in PM C. Minimize caffeine, chocolate, alcohol D. Replace throat clearing with swallowing/drinking maneuver E. Visit with [facility]  3. If needed:  A. AirSupra  - 2 inhalations every 4-6 hours B. Antihistamine  4. Return to clinic in 6 months or earlier if problem  5. Influenza = Tamiflu. Covid = Paxlovid  Alieyah still has a fair amount of airway irritation and I think most of this is in her throat and she would be best served by visiting with [facility] voice disorder center for further evaluation of this problem.  I did have a talk with her today about caffeine and alcohol consumption.  It is not as though she drinks a large amount of these products but there is probably enough to cause some problems with reflux  and throat irritation.  She will remain on anti-inflammatory agents for airway as noted above.  I will see her back in this clinic in 6 months or earlier if there is a problem.   Camellia Denis, MD Allergy / Immunology [facility]

## 2024-05-13 ENCOUNTER — Encounter: Payer: Self-pay | Admitting: Allergy and Immunology

## 2024-05-17 ENCOUNTER — Ambulatory Visit: Payer: Self-pay | Admitting: Gastroenterology

## 2024-05-21 ENCOUNTER — Other Ambulatory Visit: Payer: Self-pay

## 2024-05-21 ENCOUNTER — Other Ambulatory Visit: Payer: Self-pay | Admitting: Allergy and Immunology

## 2024-05-21 MED ORDER — AIRSUPRA 90-80 MCG/ACT IN AERO: 2.0000 | INHALATION_SPRAY | RESPIRATORY_TRACT | 2 refills | Status: AC | PRN

## 2024-05-29 ENCOUNTER — Telehealth: Payer: Self-pay

## 2024-05-29 NOTE — Telephone Encounter (Signed)
 Patient has been referred to [facility]  Appointment Date: 09/02/2024  Appointment Time: 12:30 p.m  [address], [place] [address] (2nd floor)  Phone number: [phone] Fax number: [phone]  Patient informed.

## 2024-07-18 ENCOUNTER — Other Ambulatory Visit: Payer: Self-pay
